# Patient Record
Sex: Female | Born: 1945 | Race: White | Hispanic: No | Marital: Married | Smoking: Never smoker
Health system: Southern US, Community
[De-identification: ages and names within clinical notes are randomized; demographics above are authoritative.]

## PROBLEM LIST (undated history)

## (undated) DIAGNOSIS — C801 Malignant (primary) neoplasm, unspecified: Secondary | ICD-10-CM

## (undated) DIAGNOSIS — I4891 Unspecified atrial fibrillation: Secondary | ICD-10-CM

## (undated) HISTORY — DX: Unspecified atrial fibrillation: I48.91

## (undated) HISTORY — DX: Malignant (primary) neoplasm, unspecified: C80.1

## (undated) HISTORY — PX: BREAST EXCISIONAL BIOPSY: SUR124

## (undated) HISTORY — PX: OTHER SURGICAL HISTORY: SHX169

## (undated) HISTORY — PX: ABDOMINAL HYSTERECTOMY: SHX81

---

## 1998-04-16 ENCOUNTER — Other Ambulatory Visit: Admission: RE | Admit: 1998-04-16 | Discharge: 1998-04-16 | Payer: Self-pay

## 1999-04-15 ENCOUNTER — Other Ambulatory Visit: Admission: RE | Admit: 1999-04-15 | Discharge: 1999-04-15 | Payer: Self-pay | Admitting: Family Medicine

## 2004-05-08 ENCOUNTER — Other Ambulatory Visit: Admission: RE | Admit: 2004-05-08 | Discharge: 2004-05-08 | Payer: Self-pay | Admitting: *Deleted

## 2005-05-11 ENCOUNTER — Other Ambulatory Visit: Admission: RE | Admit: 2005-05-11 | Discharge: 2005-05-11 | Payer: Self-pay | Admitting: *Deleted

## 2005-07-06 ENCOUNTER — Encounter: Admission: RE | Admit: 2005-07-06 | Discharge: 2005-07-06 | Payer: Self-pay | Admitting: Family Medicine

## 2006-05-20 ENCOUNTER — Other Ambulatory Visit: Admission: RE | Admit: 2006-05-20 | Discharge: 2006-05-20 | Payer: Self-pay | Admitting: *Deleted

## 2006-07-07 ENCOUNTER — Encounter: Admission: RE | Admit: 2006-07-07 | Discharge: 2006-07-07 | Payer: Self-pay | Admitting: Family Medicine

## 2007-07-11 ENCOUNTER — Encounter: Admission: RE | Admit: 2007-07-11 | Discharge: 2007-07-11 | Payer: Self-pay | Admitting: *Deleted

## 2008-07-11 ENCOUNTER — Encounter: Admission: RE | Admit: 2008-07-11 | Discharge: 2008-07-11 | Payer: Self-pay | Admitting: Family Medicine

## 2009-05-29 ENCOUNTER — Other Ambulatory Visit: Admission: RE | Admit: 2009-05-29 | Discharge: 2009-05-29 | Payer: Self-pay | Admitting: Family Medicine

## 2009-07-16 ENCOUNTER — Encounter: Admission: RE | Admit: 2009-07-16 | Discharge: 2009-07-16 | Payer: Self-pay | Admitting: Family Medicine

## 2010-07-16 ENCOUNTER — Encounter: Admission: RE | Admit: 2010-07-16 | Discharge: 2010-07-16 | Payer: Self-pay | Admitting: Internal Medicine

## 2010-07-17 ENCOUNTER — Encounter: Admission: RE | Admit: 2010-07-17 | Discharge: 2010-07-17 | Payer: Self-pay | Admitting: Internal Medicine

## 2011-06-09 ENCOUNTER — Other Ambulatory Visit: Payer: Self-pay | Admitting: Internal Medicine

## 2011-06-09 DIAGNOSIS — Z1231 Encounter for screening mammogram for malignant neoplasm of breast: Secondary | ICD-10-CM

## 2011-07-23 ENCOUNTER — Ambulatory Visit: Payer: Self-pay

## 2012-04-14 ENCOUNTER — Ambulatory Visit
Admission: RE | Admit: 2012-04-14 | Discharge: 2012-04-14 | Disposition: A | Discharge status: Home / Self Care | Payer: Medicare Other | Source: Ambulatory Visit | Attending: Internal Medicine | Admitting: Internal Medicine

## 2012-04-14 DIAGNOSIS — Z1231 Encounter for screening mammogram for malignant neoplasm of breast: Secondary | ICD-10-CM

## 2012-06-10 ENCOUNTER — Other Ambulatory Visit: Payer: Self-pay | Admitting: Internal Medicine

## 2012-06-10 DIAGNOSIS — Z780 Asymptomatic menopausal state: Secondary | ICD-10-CM

## 2012-06-10 DIAGNOSIS — M949 Disorder of cartilage, unspecified: Secondary | ICD-10-CM

## 2012-07-20 ENCOUNTER — Ambulatory Visit
Admission: RE | Admit: 2012-07-20 | Discharge: 2012-07-20 | Disposition: A | Discharge status: Home / Self Care | Payer: BC Managed Care – PPO | Source: Ambulatory Visit | Attending: Internal Medicine | Admitting: Internal Medicine

## 2012-07-20 DIAGNOSIS — M899 Disorder of bone, unspecified: Secondary | ICD-10-CM

## 2012-07-20 DIAGNOSIS — Z780 Asymptomatic menopausal state: Secondary | ICD-10-CM

## 2013-03-06 ENCOUNTER — Other Ambulatory Visit: Payer: Self-pay

## 2013-03-06 DIAGNOSIS — Z1231 Encounter for screening mammogram for malignant neoplasm of breast: Secondary | ICD-10-CM

## 2013-04-19 ENCOUNTER — Ambulatory Visit
Admission: RE | Admit: 2013-04-19 | Discharge: 2013-04-19 | Disposition: A | Discharge status: Home / Self Care | Payer: Medicare Other | Source: Ambulatory Visit

## 2013-04-19 DIAGNOSIS — Z1231 Encounter for screening mammogram for malignant neoplasm of breast: Secondary | ICD-10-CM

## 2014-03-14 ENCOUNTER — Other Ambulatory Visit: Payer: Self-pay

## 2014-03-14 DIAGNOSIS — Z1231 Encounter for screening mammogram for malignant neoplasm of breast: Secondary | ICD-10-CM

## 2014-04-24 ENCOUNTER — Encounter (INDEPENDENT_AMBULATORY_CARE_PROVIDER_SITE_OTHER): Payer: Self-pay

## 2014-04-24 ENCOUNTER — Ambulatory Visit
Admission: RE | Admit: 2014-04-24 | Discharge: 2014-04-24 | Disposition: A | Discharge status: Home / Self Care | Payer: 59 | Source: Ambulatory Visit

## 2014-04-24 DIAGNOSIS — Z1231 Encounter for screening mammogram for malignant neoplasm of breast: Secondary | ICD-10-CM

## 2014-06-27 ENCOUNTER — Ambulatory Visit (INDEPENDENT_AMBULATORY_CARE_PROVIDER_SITE_OTHER): Payer: 59

## 2014-06-27 VITALS — BP 138/88 | HR 69 | Resp 18

## 2014-06-27 DIAGNOSIS — M7750 Other enthesopathy of unspecified foot: Secondary | ICD-10-CM

## 2014-06-27 DIAGNOSIS — M779 Enthesopathy, unspecified: Principal | ICD-10-CM

## 2014-06-27 DIAGNOSIS — M19079 Primary osteoarthritis, unspecified ankle and foot: Secondary | ICD-10-CM

## 2014-06-27 DIAGNOSIS — M778 Other enthesopathies, not elsewhere classified: Secondary | ICD-10-CM

## 2014-06-27 DIAGNOSIS — M722 Plantar fascial fibromatosis: Secondary | ICD-10-CM

## 2014-06-27 DIAGNOSIS — I872 Venous insufficiency (chronic) (peripheral): Secondary | ICD-10-CM

## 2014-06-27 NOTE — Progress Notes (Signed)
   Subjective:    Patient ID: Carla Ross, female    DOB: 1946/06/26, 68 y.o.   MRN: [id number]  HPI I AM IN NEED OF SOME INSERTS AND I DO WALK 2 MILES AND I ALSO HAVE AN INGROWN TOENAIL ON THE LEFT AND I WENT TO SEE DR JEFFRIES BECAUSE I THOUGHT I HAD A STRESS FRACTURE BUT HE STATED THAT I DID NOT AND I HAVE THESE PLACES ON THE TOP OF BOTH FEET    Review of Systems  Musculoskeletal:       JOINT PAIN  Hematological: Bruises/bleeds easily.  All other systems reviewed and are negative.      Objective:   Physical Exam Is a 68 year old white female well-developed well-nourished oriented x3 presents this time with vital signs stable patient is requesting new orthotics is relatively high arch her cavus-type foot has been wearing orthotics for plantar fascial symptomology and capsulitis the midfoot bilateral for years orthotics are currently worn out in the replacing shoes that she lost her orthotic and lower they're worn out in patient is requesting the orthoses at this time.  Lower extremity objective findings reveal neurovascular status is intact pedal pulses are palpable DP and PT +2/4 bilateral capillary refill time 3 seconds all digits mild varicosities noted patient candidate for compression stockings at elastic therapy a coupon is given. Lower extremity neurologically epicritic and proprioceptive sensations intact and symmetric bilateral normal plantar response and DTRs noted neurologically skin color pigment normal hair growth absent nails somewhat criptotic otherwise unremarkable no signs of infection orthopedic exam rectus foot type cavus-type arch there had been a history of plantar fascial symptomology clinically there is some spurring of the Lisfranc's first and second MTP her first and second met cuneiform articulations there is some arthrosis Lisfranc joint bilateral capsulitis as well as history plantar fasciitis symptoms was managed with orthoses. Patient wears sandals that have  support such as bionic or [employer]. 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 Make arrangements for orthotic casting at her convenience a semiflexed orthotic cork and leather type design would be beneficial patient will be reappointed her convenience for followup  Harriet Masson DPM

## 2014-06-27 NOTE — Patient Instructions (Signed)

## 2014-07-04 ENCOUNTER — Other Ambulatory Visit: Payer: 59

## 2014-07-04 DIAGNOSIS — M722 Plantar fascial fibromatosis: Secondary | ICD-10-CM

## 2014-07-12 ENCOUNTER — Other Ambulatory Visit: Payer: Self-pay | Admitting: Internal Medicine

## 2014-07-12 DIAGNOSIS — M8580 Other specified disorders of bone density and structure, unspecified site: Secondary | ICD-10-CM

## 2014-07-26 ENCOUNTER — Ambulatory Visit
Admission: RE | Admit: 2014-07-26 | Discharge: 2014-07-26 | Disposition: A | Discharge status: Home / Self Care | Payer: 59 | Source: Ambulatory Visit | Attending: Internal Medicine | Admitting: Internal Medicine

## 2014-07-26 DIAGNOSIS — M8580 Other specified disorders of bone density and structure, unspecified site: Secondary | ICD-10-CM

## 2014-08-02 ENCOUNTER — Ambulatory Visit (INDEPENDENT_AMBULATORY_CARE_PROVIDER_SITE_OTHER): Payer: 59

## 2014-08-02 VITALS — BP 109/70 | HR 64 | Resp 12

## 2014-08-02 DIAGNOSIS — M19079 Primary osteoarthritis, unspecified ankle and foot: Secondary | ICD-10-CM

## 2014-08-02 DIAGNOSIS — M779 Enthesopathy, unspecified: Principal | ICD-10-CM

## 2014-08-02 DIAGNOSIS — M722 Plantar fascial fibromatosis: Secondary | ICD-10-CM

## 2014-08-02 DIAGNOSIS — M7750 Other enthesopathy of unspecified foot: Secondary | ICD-10-CM

## 2014-08-02 DIAGNOSIS — M778 Other enthesopathies, not elsewhere classified: Secondary | ICD-10-CM

## 2014-08-02 NOTE — Patient Instructions (Signed)

## 2014-08-02 NOTE — Progress Notes (Signed)
   Subjective:    Patient ID: Carla Ross, female    DOB: 01-Jan-1946, 68 y.o.   MRN: [id number]  HPI  DISPENSED ORTHOTICS AND GIVEN INSTRUCTION.  Review of Systems no new findings or systemic changes noted     Objective:   Physical Exam Neurovascular status is intact pedal pulses epicritic sensations intact and symmetric there is normal plantar response patient does have capsulitis Lisfranc's as was plantar fascial symptomology which responded to taping at this time new functional orthoses with appropriate [employer] are dispensed orthotics fit and contour well to the patient's foot oral and written instructions for use of the orthotic and recheck in recheck in one to 2 months for adjustments if needed       Assessment & Plan:  Assessment this time capsulitis Lisfranc's as well as plantar fasciitis symptomology responded to biomechanical support should respond to functional orthotics which are dispensed at this time with instructions recheck in one to 2 months for orthotic adjustments if needed orthotics are fitted capacious casual shoes at this time should work in her casual as well as or athletic shoes.  Harriet Masson DPM

## 2014-08-03 ENCOUNTER — Ambulatory Visit: Payer: 59

## 2015-03-19 ENCOUNTER — Other Ambulatory Visit: Payer: Self-pay

## 2015-03-19 DIAGNOSIS — Z1231 Encounter for screening mammogram for malignant neoplasm of breast: Secondary | ICD-10-CM

## 2015-04-10 DIAGNOSIS — E7800 Pure hypercholesterolemia, unspecified: Secondary | ICD-10-CM

## 2015-04-10 DIAGNOSIS — I480 Paroxysmal atrial fibrillation: Secondary | ICD-10-CM

## 2015-04-10 HISTORY — DX: Pure hypercholesterolemia, unspecified: E78.00

## 2015-04-10 HISTORY — DX: Paroxysmal atrial fibrillation: I48.0

## 2015-04-30 ENCOUNTER — Ambulatory Visit
Admission: RE | Admit: 2015-04-30 | Discharge: 2015-04-30 | Disposition: A | Discharge status: Home / Self Care | Payer: Medicare Other | Source: Ambulatory Visit

## 2015-04-30 DIAGNOSIS — Z1231 Encounter for screening mammogram for malignant neoplasm of breast: Secondary | ICD-10-CM

## 2016-04-23 ENCOUNTER — Other Ambulatory Visit: Payer: Self-pay | Admitting: Internal Medicine

## 2016-04-23 DIAGNOSIS — Z1231 Encounter for screening mammogram for malignant neoplasm of breast: Secondary | ICD-10-CM

## 2016-05-07 ENCOUNTER — Ambulatory Visit
Admission: RE | Admit: 2016-05-07 | Discharge: 2016-05-07 | Disposition: A | Discharge status: Home / Self Care | Payer: Medicare Other | Source: Ambulatory Visit | Attending: Internal Medicine | Admitting: Internal Medicine

## 2016-05-07 DIAGNOSIS — Z1231 Encounter for screening mammogram for malignant neoplasm of breast: Secondary | ICD-10-CM

## 2017-05-07 ENCOUNTER — Other Ambulatory Visit: Payer: Self-pay | Admitting: Internal Medicine

## 2017-05-07 DIAGNOSIS — Z1231 Encounter for screening mammogram for malignant neoplasm of breast: Secondary | ICD-10-CM

## 2017-05-11 ENCOUNTER — Ambulatory Visit
Admission: RE | Admit: 2017-05-11 | Discharge: 2017-05-11 | Disposition: A | Discharge status: Home / Self Care | Payer: Medicare Other | Source: Ambulatory Visit | Attending: Internal Medicine | Admitting: Internal Medicine

## 2017-05-11 DIAGNOSIS — Z1231 Encounter for screening mammogram for malignant neoplasm of breast: Secondary | ICD-10-CM

## 2017-05-25 ENCOUNTER — Ambulatory Visit (INDEPENDENT_AMBULATORY_CARE_PROVIDER_SITE_OTHER): Payer: Medicare Other | Admitting: Cardiology

## 2017-05-25 ENCOUNTER — Encounter: Payer: Self-pay | Admitting: Cardiology

## 2017-05-25 VITALS — BP 112/68 | HR 60 | Resp 10 | Ht 67.0 in | Wt 139.0 lb

## 2017-05-25 DIAGNOSIS — I480 Paroxysmal atrial fibrillation: Secondary | ICD-10-CM

## 2017-05-25 DIAGNOSIS — E7800 Pure hypercholesterolemia, unspecified: Secondary | ICD-10-CM | POA: Diagnosis not present

## 2017-05-25 NOTE — Progress Notes (Signed)
Cardiology Office Note:    Date:  05/25/2017   ID:  Carla Ross, DOB Jun 27, 1946, MRN [id number]  PCP:  Ernestene Kiel, MD  Cardiologist:  Jenne Campus, MD    Referring MD: Ernestene Kiel, MD   Chief Complaint  Patient presents with  . Follow-up  I'm doing fine  History of Present Illness:    Carla Ross is a 71 y.o. female  she is doing well. She had cold symptoms few weeks ago and had some palpitations but nonsustained at the time since that time she's feeling well. No chest pain tightness squeezing pressure burning chest.  Past Medical History:  Diagnosis Date  . A-fib ([place])   . Cancer [facility])     Past Surgical History:  Procedure Laterality Date  . ABDOMINAL HYSTERECTOMY    . BREAST EXCISIONAL BIOPSY Right    benign  . PILENDAL CYST SURGERY      Current Medications: Current Meds  Medication Sig  . aspirin EC 325 MG tablet Take 325 mg by mouth daily.  . Calcium Carbonate (CALTRATE 600 PO) Take by mouth.  . Multiple Vitamin (MULTIVITAMIN) capsule Take 1 capsule by mouth daily.  . Omega-3 Fatty Acids (FISH OIL) 500 MG CAPS Take 520 mg by mouth 2 (two) times daily.      Allergies:   Statins   Social History   Social History  . Marital status: Married    Spouse name: N/A  . Number of children: N/A  . Years of education: N/A   Social History Main Topics  . Smoking status: Never Smoker  . Smokeless tobacco: Never Used  . Alcohol use Yes  . Drug use: No  . Sexual activity: Not Asked   Other Topics Concern  . None   Social History Narrative  . None     Family History: The patient's family history includes Liver cancer in her father; Ovarian cancer in her mother. ROS:   Please see the history of present illness.    All 14 point review of systems negative except as described per history of present illness  EKGs/Labs/Other Studies Reviewed:      Recent Labs: No results found for requested labs within last 8760 hours.  Recent  Lipid Panel No results found for: CHOL, TRIG, HDL, CHOLHDL, VLDL, LDLCALC, LDLDIRECT  Physical Exam:    VS:  BP 112/68   Pulse 60   Resp 10   Ht 5\' 7"  (1.702 m)   Wt 139 lb (63 kg)   BMI 21.77 kg/m     Wt Readings from Last 3 Encounters:  05/25/17 139 lb (63 kg)     GEN:  Well nourished, well developed in no acute distress HEENT: Normal NECK: No JVD; No carotid bruits LYMPHATICS: No lymphadenopathy CARDIAC: RRR, no murmurs, no rubs, no gallops RESPIRATORY:  Clear to auscultation without rales, wheezing or rhonchi  ABDOMEN: Soft, non-tender, non-distended MUSCULOSKELETAL:  No edema; No deformity  SKIN: Warm and dry LOWER EXTREMITIES: no swelling NEUROLOGIC:  Alert and oriented x 3 PSYCHIATRIC:  Normal affect   ASSESSMENT:    1. Paroxysmal atrial fibrillation (HCC)   2. Pure hypercholesterolemia    PLAN:    In order of problems listed above:  1. Paroxysmal atrial fibrillation: Denies having any palpitations. Overall seems to be doing well. I will continue present management. She is only on aspirin. She prefers to be that way. 2. Dyslipidemia: We'll have a long discussion about that. Her HDL is high her LDL is 109.  She does not want to take statin. We had discussion regarding fish oil and that recent studies showing [place] doubtful benefits. She wants to continue which is fine with me.   Medication Adjustments/Labs and Tests Ordered: Current medicines are reviewed at length with the patient today.  Concerns regarding medicines are outlined above.  No orders of the defined types were placed in this encounter.  Medication changes: No orders of the defined types were placed in this encounter.   Signed, Park Liter, MD, [facility] 05/25/2017 8:49 AM    [place]

## 2017-05-25 NOTE — Patient Instructions (Signed)
Medication Instructions:  Your physician recommends that you continue on your current medications as directed. Please refer to the Current Medication list given to you today.  Labwork: None   Testing/Procedures: None   Follow-Up: Your physician wants you to follow-up in: 1 year. You will receive a reminder letter in the mail two months in advance. If you don't receive a letter, please call our office to schedule the follow-up appointment.  Any Other Special Instructions Will Be Listed Below (If Applicable).  Please note that any paperwork needing to be filled out by the provider will need to be addressed at the front desk prior to seeing the provider. Please note that any paperwork FMLA, Disability or other documents regarding health condition is subject to a $25.00 charge that must be received prior to completion of paperwork.    If you need a refill on your cardiac medications before your next appointment, please call your pharmacy.

## 2018-03-31 ENCOUNTER — Other Ambulatory Visit: Payer: Self-pay | Admitting: Internal Medicine

## 2018-03-31 DIAGNOSIS — Z1231 Encounter for screening mammogram for malignant neoplasm of breast: Secondary | ICD-10-CM

## 2018-05-16 ENCOUNTER — Ambulatory Visit
Admission: RE | Admit: 2018-05-16 | Discharge: 2018-05-16 | Disposition: A | Discharge status: Home / Self Care | Payer: Medicare Other | Source: Ambulatory Visit | Attending: Internal Medicine | Admitting: Internal Medicine

## 2018-05-16 DIAGNOSIS — Z1231 Encounter for screening mammogram for malignant neoplasm of breast: Secondary | ICD-10-CM

## 2018-06-13 ENCOUNTER — Encounter: Payer: Self-pay | Admitting: Cardiology

## 2018-06-13 ENCOUNTER — Ambulatory Visit: Payer: Medicare Other | Admitting: Cardiology

## 2018-06-13 VITALS — BP 122/74 | HR 66 | Ht 67.0 in | Wt 140.8 lb

## 2018-06-13 DIAGNOSIS — I480 Paroxysmal atrial fibrillation: Secondary | ICD-10-CM | POA: Diagnosis not present

## 2018-06-13 DIAGNOSIS — E7800 Pure hypercholesterolemia, unspecified: Secondary | ICD-10-CM

## 2018-06-13 NOTE — Patient Instructions (Signed)
Medication Instructions:  Your physician recommends that you continue on your current medications as directed. Please refer to the Current Medication list given to you today.   Labwork: None  Testing/Procedures: You had an EKG today.   Your physician has requested that you have an echocardiogram. Echocardiography is a painless test that uses sound waves to create images of your heart. It provides your doctor with information about the size and shape of your heart and how well your heart's chambers and valves are working. This procedure takes approximately one hour. There are no restrictions for this procedure.   Follow-Up: Your physician wants you to follow-up in: 3 months. You will receive a reminder letter in the mail two months in advance. If you don't receive a letter, please call our office to schedule the follow-up appointment.   If you need a refill on your cardiac medications before your next appointment, please call your pharmacy.   Thank you for choosing [facility]! Chardonnay Holzmann, RN [phone]   Echocardiogram An echocardiogram, or echocardiography, uses sound waves (ultrasound) to produce an image of your heart. The echocardiogram is simple, painless, obtained within a short period of time, and offers valuable information to your health care provider. The images from an echocardiogram can provide information such as:  Evidence of coronary artery disease (CAD).  Heart size.  Heart muscle function.  Heart valve function.  Aneurysm detection.  Evidence of a past heart attack.  Fluid buildup around the heart.  Heart muscle thickening.  Assess heart valve function.  Tell a health care provider about:  Any allergies you have.  All medicines you are taking, including vitamins, herbs, eye drops, creams, and over-the-counter medicines.  Any problems you or family members have had with anesthetic medicines.  Any blood disorders you have.  Any  surgeries you have had.  Any medical conditions you have.  Whether you are pregnant or may be pregnant. What happens before the procedure? No special preparation is needed. Eat and drink normally. What happens during the procedure?  In order to produce an image of your heart, gel will be applied to your chest and a wand-like tool (transducer) will be moved over your chest. The gel will help transmit the sound waves from the transducer. The sound waves will harmlessly bounce off your heart to allow the heart images to be captured in real-time motion. These images will then be recorded.  You may need an IV to receive a medicine that improves the quality of the pictures. What happens after the procedure? You may return to your normal schedule including diet, activities, and medicines, unless your health care provider tells you otherwise. This information is not intended to replace advice given to you by your health care provider. Make sure you discuss any questions you have with your health care provider. Document Released: 10/02/2000 Document Revised: 05/23/2016 Document Reviewed: 06/12/2013 Elsevier Interactive Patient Education  2017 Elsevier Inc.  

## 2018-06-13 NOTE — Progress Notes (Signed)
Cardiology Office Note:    Date:  06/13/2018   ID:  Carla Ross, DOB [birth year], MRN [id number]  PCP:  Ernestene Kiel, MD  Cardiologist:  Jenne Campus, MD    Referring MD: Ernestene Kiel, MD   Chief Complaint  Patient presents with  . Follow-up  Doing fair  History of Present Illness:    Carla Ross is a 72 y.o. female with history of paroxysmal atrial fibrillation.  Also dyslipidemia comes today to office for follow-up for last few weeks she had some palpitations.  She attributes this to the fact that she got very stressful situation with her husband her husband has depression and he is actually being in psychiatry hospital since last Wednesday.  There are plans to discharge home tomorrow no chest pain tightness squeezing pressure burning chest.  Described to have palpitations before but now since her husband is in the hospital situation stabilized she is doing well  Past Medical History:  Diagnosis Date  . A-fib ([place])   . Cancer [facility])     Past Surgical History:  Procedure Laterality Date  . ABDOMINAL HYSTERECTOMY    . BREAST EXCISIONAL BIOPSY Right    benign  . PILENDAL CYST SURGERY      Current Medications: Current Meds  Medication Sig  . aspirin EC 325 MG tablet Take 325 mg by mouth daily.  . Calcium Carbonate (CALTRATE 600 PO) Take by mouth.  . Multiple Vitamin (MULTIVITAMIN) capsule Take 1 capsule by mouth daily.  . Omega-3 Fatty Acids (FISH OIL) 1200 MG CAPS Take 1,200 mg by mouth 2 (two) times daily.      Allergies:   Statins   Social History   Socioeconomic History  . Marital status: Married    Spouse name: Not on file  . Number of children: Not on file  . Years of education: Not on file  . Highest education level: Not on file  Occupational History  . Not on file  Social Needs  . Financial resource strain: Not on file  . Food insecurity:    Worry: Not on file    Inability: Not on file  . Transportation needs:    Medical:  Not on file    Non-medical: Not on file  Tobacco Use  . Smoking status: Never Smoker  . Smokeless tobacco: Never Used  Substance and Sexual Activity  . Alcohol use: Yes  . Drug use: No  . Sexual activity: Not on file  Lifestyle  . Physical activity:    Days per week: Not on file    Minutes per session: Not on file  . Stress: Not on file  Relationships  . Social connections:    Talks on phone: Not on file    Gets together: Not on file    Attends religious service: Not on file    Active member of club or organization: Not on file    Attends meetings of clubs or organizations: Not on file    Relationship status: Not on file  Other Topics Concern  . Not on file  Social History Narrative  . Not on file     Family History: The patient's family history includes Liver cancer in her father; Ovarian cancer in her mother. ROS:   Please see the history of present illness.    All 14 point review of systems negative except as described per history of present illness  EKGs/Labs/Other Studies Reviewed:    EKG today showed normal sinus rhythm right atrial enlargement normal  ST segment changes.  Recent Labs: No results found for requested labs within last 8760 hours.  Recent Lipid Panel No results found for: CHOL, TRIG, HDL, CHOLHDL, VLDL, LDLCALC, LDLDIRECT  Physical Exam:    VS:  BP 122/74   Pulse 66   Ht 5\' 7"  (1.702 m)   Wt 140 lb 12.8 oz (63.9 kg)   SpO2 98%   BMI 22.05 kg/m     Wt Readings from Last 3 Encounters:  06/13/18 140 lb 12.8 oz (63.9 kg)  05/25/17 139 lb (63 kg)     GEN:  Well nourished, well developed in no acute distress HEENT: Normal NECK: No JVD; No carotid bruits LYMPHATICS: No lymphadenopathy CARDIAC: RRR, no murmurs, no rubs, no gallops RESPIRATORY:  Clear to auscultation without rales, wheezing or rhonchi  ABDOMEN: Soft, non-tender, non-distended MUSCULOSKELETAL:  No edema; No deformity  SKIN: Warm and dry LOWER EXTREMITIES: no  swelling NEUROLOGIC:  Alert and oriented x 3 PSYCHIATRIC:  Normal affect   ASSESSMENT:    1. Paroxysmal atrial fibrillation (HCC)   2. Pure hypercholesterolemia    PLAN:    In order of problems listed above:  1. Paroxysmal atrial fibrillation.  I will ask her to have echocardiogram the purpose of echocardiogram is to check the left atrial size.  I will see her back in about 3 months to see how she does if she still have recurrences of arrhythmia then we will be forced to put monitor on her.  Her chads 2 Vascor equals 2 she does not want to take anticoagulation she wants to stay on aspirin.  In the future however if we discover that she does still significant frequent episode of atrial fibrillation then we will just has this issue again 2. Dyslipidemia her primary care physician check a fasting lipid profile lately and apparently is very good we will try to get a copy of it.  See her back in my office 3 months sooner if she get a problem   Medication Adjustments/Labs and Tests Ordered: Current medicines are reviewed at length with the patient today.  Concerns regarding medicines are outlined above.  No orders of the defined types were placed in this encounter.  Medication changes: No orders of the defined types were placed in this encounter.   Signed, Park Liter, MD, [facility] 06/13/2018 1:55 PM    [place]

## 2018-06-14 DIAGNOSIS — M7501 Adhesive capsulitis of right shoulder: Secondary | ICD-10-CM

## 2018-06-14 HISTORY — DX: Adhesive capsulitis of right shoulder: M75.01

## 2018-07-13 ENCOUNTER — Ambulatory Visit: Payer: Medicare Other | Admitting: Cardiology

## 2018-07-29 ENCOUNTER — Ambulatory Visit (INDEPENDENT_AMBULATORY_CARE_PROVIDER_SITE_OTHER): Payer: Medicare Other

## 2018-07-29 DIAGNOSIS — I480 Paroxysmal atrial fibrillation: Secondary | ICD-10-CM

## 2018-07-29 NOTE — Progress Notes (Signed)
Complete echocardiogram has been performed.  Jimmy Javian Nudd RDCS, RVT 

## 2019-03-09 DIAGNOSIS — M25562 Pain in left knee: Secondary | ICD-10-CM | POA: Insufficient documentation

## 2019-04-06 ENCOUNTER — Other Ambulatory Visit: Payer: Self-pay | Admitting: Internal Medicine

## 2019-04-06 DIAGNOSIS — Z1231 Encounter for screening mammogram for malignant neoplasm of breast: Secondary | ICD-10-CM

## 2019-05-23 ENCOUNTER — Ambulatory Visit
Admission: RE | Admit: 2019-05-23 | Discharge: 2019-05-23 | Disposition: A | Discharge status: Home / Self Care | Payer: Medicare Other | Source: Ambulatory Visit | Attending: Internal Medicine | Admitting: Internal Medicine

## 2019-05-23 ENCOUNTER — Other Ambulatory Visit: Payer: Self-pay

## 2019-05-23 DIAGNOSIS — Z1231 Encounter for screening mammogram for malignant neoplasm of breast: Secondary | ICD-10-CM

## 2019-05-24 ENCOUNTER — Other Ambulatory Visit: Payer: Self-pay | Admitting: Internal Medicine

## 2019-05-24 DIAGNOSIS — E2839 Other primary ovarian failure: Secondary | ICD-10-CM

## 2019-06-09 ENCOUNTER — Ambulatory Visit: Payer: Medicare Other | Admitting: Cardiology

## 2019-06-12 ENCOUNTER — Encounter: Payer: Self-pay | Admitting: Cardiology

## 2019-06-12 ENCOUNTER — Ambulatory Visit (INDEPENDENT_AMBULATORY_CARE_PROVIDER_SITE_OTHER): Payer: Medicare Other | Admitting: Cardiology

## 2019-06-12 ENCOUNTER — Other Ambulatory Visit: Payer: Self-pay

## 2019-06-12 VITALS — BP 116/80 | HR 68 | Ht 67.0 in | Wt 142.8 lb

## 2019-06-12 DIAGNOSIS — E7800 Pure hypercholesterolemia, unspecified: Secondary | ICD-10-CM | POA: Diagnosis not present

## 2019-06-12 DIAGNOSIS — R002 Palpitations: Secondary | ICD-10-CM

## 2019-06-12 DIAGNOSIS — I480 Paroxysmal atrial fibrillation: Secondary | ICD-10-CM | POA: Diagnosis not present

## 2019-06-12 HISTORY — DX: Palpitations: R00.2

## 2019-06-12 NOTE — Patient Instructions (Signed)

## 2019-06-12 NOTE — Progress Notes (Signed)
Cardiology Office Note:    Date:  06/12/2019   ID:  Carla Ross, DOB March 27, 1946, MRN [id number]  PCP:  Ernestene Kiel, MD  Cardiologist:  Jenne Campus, MD    Referring MD: Ernestene Kiel, MD   Chief Complaint  Patient presents with  . Follow-up  Doing well  History of Present Illness:    Carla Ross is a 73 y.o. female with paroxysmal atrial fibrillation but there were episodes.  She described to have some palpitations only when she get upset or when she is dehydrated.  Does happen very rarely we talked about how we can document this and I offered her cardia so she can purchase this device and record her palpitation anytime she has some.  Otherwise doing well.  No chest pain tightness squeezing pressure pain chest.  Recently had cholesterol checked which was good.  We will try to get a copy of it.  Past Medical History:  Diagnosis Date  . A-fib ([place])   . Cancer [facility])     Past Surgical History:  Procedure Laterality Date  . ABDOMINAL HYSTERECTOMY    . BREAST EXCISIONAL BIOPSY Right    benign  . PILENDAL CYST SURGERY      Current Medications: Current Meds  Medication Sig  . aspirin EC 325 MG tablet Take 325 mg by mouth daily.  . Calcium Carbonate (CALTRATE 600 PO) Take by mouth.  . Multiple Vitamin (MULTIVITAMIN) capsule Take 1 capsule by mouth daily.  . Omega-3 Fatty Acids (FISH OIL) 1200 MG CAPS Take 1,200 mg by mouth 2 (two) times daily.      Allergies:   Statins   Social History   Socioeconomic History  . Marital status: Married    Spouse name: Not on file  . Number of children: Not on file  . Years of education: Not on file  . Highest education level: Not on file  Occupational History  . Not on file  Social Needs  . Financial resource strain: Not on file  . Food insecurity    Worry: Not on file    Inability: Not on file  . Transportation needs    Medical: Not on file    Non-medical: Not on file  Tobacco Use  . Smoking status:  Never Smoker  . Smokeless tobacco: Never Used  Substance and Sexual Activity  . Alcohol use: Yes  . Drug use: No  . Sexual activity: Not on file  Lifestyle  . Physical activity    Days per week: Not on file    Minutes per session: Not on file  . Stress: Not on file  Relationships  . Social Herbalist on phone: Not on file    Gets together: Not on file    Attends religious service: Not on file    Active member of club or organization: Not on file    Attends meetings of clubs or organizations: Not on file    Relationship status: Not on file  Other Topics Concern  . Not on file  Social History Narrative  . Not on file     Family History: The patient's family history includes Liver cancer in her father; Ovarian cancer in her mother. ROS:   Please see the history of present illness.    All 14 point review of systems negative except as described per history of present illness  EKGs/Labs/Other Studies Reviewed:      Recent Labs: No results found for requested labs within last 8760  hours.  Recent Lipid Panel No results found for: CHOL, TRIG, HDL, CHOLHDL, VLDL, LDLCALC, LDLDIRECT  Physical Exam:    VS:  BP 116/80   Pulse 68   Ht 5\' 7"  (1.702 m)   Wt 142 lb 12.8 oz (64.8 kg)   SpO2 98%   BMI 22.37 kg/m     Wt Readings from Last 3 Encounters:  06/12/19 142 lb 12.8 oz (64.8 kg)  06/13/18 140 lb 12.8 oz (63.9 kg)  05/25/17 139 lb (63 kg)     GEN:  Well nourished, well developed in no acute distress HEENT: Normal NECK: No JVD; No carotid bruits LYMPHATICS: No lymphadenopathy CARDIAC: RRR, no murmurs, no rubs, no gallops RESPIRATORY:  Clear to auscultation without rales, wheezing or rhonchi  ABDOMEN: Soft, non-tender, non-distended MUSCULOSKELETAL:  No edema; No deformity  SKIN: Warm and dry LOWER EXTREMITIES: no swelling NEUROLOGIC:  Alert and oriented x 3 PSYCHIATRIC:  Normal affect   ASSESSMENT:    1. Paroxysmal atrial fibrillation (HCC)   2.  Pure hypercholesterolemia   3. Palpitations    PLAN:    In order of problems listed above:  1. Paroxysmal atrial fibrillation doing well from that point review rare episode of palpitation I am not sure if this is atrial fibrillation she will purchase cardia so we can see exactly what arrhythmia family with dealing with. 2. Dyslipidemia will call primary care physician to get her fasting lipid profile 3. Palpitations plan as outlined above   Medication Adjustments/Labs and Tests Ordered: Current medicines are reviewed at length with the patient today.  Concerns regarding medicines are outlined above.  No orders of the defined types were placed in this encounter.  Medication changes: No orders of the defined types were placed in this encounter.   Signed, Park Liter, MD, [facility] 06/12/2019 9:36 AM    [place]

## 2019-06-19 ENCOUNTER — Telehealth: Payer: Self-pay | Admitting: Cardiology

## 2019-06-19 NOTE — Telephone Encounter (Signed)
Called patient. She was wanting to know what email address to send KARDIA readings to. I gave her Dr. Wendy Poet email address. No further questions.

## 2019-06-19 NOTE — Telephone Encounter (Signed)
Was supposed to get some kind of monitor and let him know the results but wants to know where to send them

## 2019-07-04 ENCOUNTER — Ambulatory Visit: Payer: Medicare Other | Admitting: Cardiology

## 2019-08-09 ENCOUNTER — Ambulatory Visit
Admission: RE | Admit: 2019-08-09 | Discharge: 2019-08-09 | Disposition: A | Discharge status: Home / Self Care | Payer: Medicare Other | Source: Ambulatory Visit | Attending: Internal Medicine | Admitting: Internal Medicine

## 2019-08-09 ENCOUNTER — Other Ambulatory Visit: Payer: Self-pay

## 2019-08-09 DIAGNOSIS — E2839 Other primary ovarian failure: Secondary | ICD-10-CM

## 2019-11-06 ENCOUNTER — Ambulatory Visit (INDEPENDENT_AMBULATORY_CARE_PROVIDER_SITE_OTHER): Payer: Medicare PPO | Admitting: Cardiology

## 2019-11-06 ENCOUNTER — Other Ambulatory Visit: Payer: Self-pay

## 2019-11-06 ENCOUNTER — Ambulatory Visit (INDEPENDENT_AMBULATORY_CARE_PROVIDER_SITE_OTHER): Payer: Medicare PPO

## 2019-11-06 ENCOUNTER — Encounter: Payer: Self-pay | Admitting: Cardiology

## 2019-11-06 VITALS — BP 130/84 | HR 72 | Ht 67.0 in | Wt 145.0 lb

## 2019-11-06 DIAGNOSIS — E7800 Pure hypercholesterolemia, unspecified: Secondary | ICD-10-CM

## 2019-11-06 DIAGNOSIS — I480 Paroxysmal atrial fibrillation: Secondary | ICD-10-CM

## 2019-11-06 DIAGNOSIS — R002 Palpitations: Secondary | ICD-10-CM | POA: Diagnosis not present

## 2019-11-06 NOTE — Patient Instructions (Addendum)
Medication Instructions:  Your physician recommends that you continue on your current medications as directed. Please refer to the Current Medication list given to you today.  *If you need a refill on your cardiac medications before your next appointment, please call your pharmacy*  Lab Work: Your physician recommends that you return for lab work today: tsh, cbc   If you have labs (blood work) drawn today and your tests are completely normal, you will receive your results only by: Marland Kitchen MyChart Message (if you have MyChart) OR . A paper copy in the mail If you have any lab test that is abnormal or we need to change your treatment, we will call you to review the results.  Testing/Procedures: Your physician has recommended that you wear a holter monitor. Holter monitors are medical devices that record the heart's electrical activity. Doctors most often use these monitors to diagnose arrhythmias. Arrhythmias are problems with the speed or rhythm of the heartbeat. The monitor is a small, portable device. You can wear one while you do your normal daily activities. This is usually used to diagnose what is causing palpitations/syncope (passing out). Wear for 7 days    Follow-Up: At [facility], you and your health needs are our priority.  As part of our continuing mission to provide you with exceptional heart care, we have created designated Provider Care Teams.  These Care Teams include your primary Cardiologist (physician) and Advanced Practice Providers (APPs -  Physician Assistants and Nurse Practitioners) who all work together to provide you with the care you need, when you need it.  Your next appointment:   1 month(s)  The format for your next appointment:   In Person  Provider:   Jenne Campus, MD  Other Instructions

## 2019-11-06 NOTE — Progress Notes (Signed)
Cardiology Office Note:    Date:  11/06/2019   ID:  SAFIRE BOLASH, DOB 12/14/1945, MRN [id number]  PCP:  Ernestene Kiel, MD  Cardiologist:  Jenne Campus, MD    Referring MD: Ernestene Kiel, MD   No chief complaint on file.   History of Present Illness:    Carla Ross is a 74 y.o. female with paroxysmal atrial fibrillation very rare episodes, dyslipidemia comes today to my office for follow-up she does have much more palpitation within a week or 2 lately.  She blames a lot of stress on that.  She thinks that it is related to very stressful situation she got at home.  Her dog is dying her husband of depression.  She actually started crying in my office.  But otherwise seems to be doing well.  Denies have any chest pain tightness squeezing pressure burning chest  Past Medical History:  Diagnosis Date  . A-fib ([place])   . Cancer [place])     Past Surgical History:  Procedure Laterality Date  . ABDOMINAL HYSTERECTOMY    . BREAST EXCISIONAL BIOPSY Right    benign  . PILENDAL CYST SURGERY      Current Medications: Current Meds  Medication Sig  . aspirin EC 325 MG tablet Take 325 mg by mouth daily.  . Calcium Carbonate (CALTRATE 600 PO) Take by mouth.  . Multiple Vitamin (MULTIVITAMIN) capsule Take 1 capsule by mouth daily.  . Omega-3 Fatty Acids (FISH OIL) 1200 MG CAPS Take 1,200 mg by mouth 2 (two) times daily.      Allergies:   Statins   Social History   Socioeconomic History  . Marital status: Married    Spouse name: Not on file  . Number of children: Not on file  . Years of education: Not on file  . Highest education level: Not on file  Occupational History  . Not on file  Tobacco Use  . Smoking status: Never Smoker  . Smokeless tobacco: Never Used  Substance and Sexual Activity  . Alcohol use: Yes  . Drug use: No  . Sexual activity: Not on file  Other Topics Concern  . Not on file  Social History Narrative  . Not on file   Social  Determinants of Health   Financial Resource Strain:   . Difficulty of Paying Living Expenses: Not on file  Food Insecurity:   . Worried About Charity fundraiser in the Last Year: Not on file  . Ran Out of Food in the Last Year: Not on file  Transportation Needs:   . Lack of Transportation (Medical): Not on file  . Lack of Transportation (Non-Medical): Not on file  Physical Activity:   . Days of Exercise per Week: Not on file  . Minutes of Exercise per Session: Not on file  Stress:   . Feeling of Stress : Not on file  Social Connections:   . Frequency of Communication with Friends and Family: Not on file  . Frequency of Social Gatherings with Friends and Family: Not on file  . Attends Religious Services: Not on file  . Active Member of Clubs or Organizations: Not on file  . Attends Archivist Meetings: Not on file  . Marital Status: Not on file     Family History: The patient's family history includes Liver cancer in her father; Ovarian cancer in her mother. ROS:   Please see the history of present illness.    All 14 point review of systems  negative except as described per history of present illness  EKGs/Labs/Other Studies Reviewed:      Recent Labs: No results found for requested labs within last 8760 hours.  Recent Lipid Panel No results found for: CHOL, TRIG, HDL, CHOLHDL, VLDL, LDLCALC, LDLDIRECT  Physical Exam:    VS:  BP 130/84 (BP Location: Left Arm, Patient Position: Sitting, Cuff Size: Normal)   Pulse 72   Ht 5\' 7"  (1.702 m)   Wt 145 lb (65.8 kg)   SpO2 98%   BMI 22.71 kg/m     Wt Readings from Last 3 Encounters:  11/06/19 145 lb (65.8 kg)  06/12/19 142 lb 12.8 oz (64.8 kg)  06/13/18 140 lb 12.8 oz (63.9 kg)     GEN:  Well nourished, well developed in no acute distress HEENT: Normal NECK: No JVD; No carotid bruits LYMPHATICS: No lymphadenopathy CARDIAC: RRR, no murmurs, no rubs, no gallops RESPIRATORY:  Clear to auscultation without  rales, wheezing or rhonchi  ABDOMEN: Soft, non-tender, non-distended MUSCULOSKELETAL:  No edema; No deformity  SKIN: Warm and dry LOWER EXTREMITIES: no swelling NEUROLOGIC:  Alert and oriented x 3 PSYCHIATRIC:  Normal affect   ASSESSMENT:    1. Paroxysmal atrial fibrillation (HCC)   2. Pure hypercholesterolemia   3. Palpitations    PLAN:    In order of problems listed above:  1. Paroxysmal atrial fibrillation she does have cardia and she does have 1 documented episode of atrial fibrillation.  She described much more palpitations right now.  I will put a Zio patch on her for a week to see what frequency of arrhythmia we have.  I will also check her CBC as well as TSH today with anticipation to start anticoagulation.  She brought results to me from September her kidney function was normal.  She will most likely require echocardiogram.  Before we initiate some antiarrhythmic therapy.  First will be AV blocking agent. 2. Dyslipidemia she brought cholesterol to me.  Her HDL is very high however LDL is also elevated.  We will continue present medications for now 3. Palpitations plans and as outlined above   Medication Adjustments/Labs and Tests Ordered: Current medicines are reviewed at length with the patient today.  Concerns regarding medicines are outlined above.  No orders of the defined types were placed in this encounter.  Medication changes: No orders of the defined types were placed in this encounter.   Signed, Park Liter, MD, [facility] 11/06/2019 10:44 AM    [place]

## 2019-11-07 LAB — CBC
Hematocrit: 43.1 % (ref 34.0–46.6)
Hemoglobin: 14.5 g/dL (ref 11.1–15.9)
MCH: 32.2 pg (ref 26.6–33.0)
MCHC: 33.6 g/dL (ref 31.5–35.7)
MCV: 96 fL (ref 79–97)
Platelets: 215 10*3/uL (ref 150–450)
RBC: 4.51 x10E6/uL (ref 3.77–5.28)
RDW: 11.4 % — ABNORMAL LOW (ref 11.7–15.4)
WBC: 6.8 10*3/uL (ref 3.4–10.8)

## 2019-11-07 LAB — TSH: TSH: 1.49 u[IU]/mL (ref 0.450–4.500)

## 2019-11-13 ENCOUNTER — Ambulatory Visit: Payer: Medicare Other | Admitting: Cardiology

## 2019-11-22 DIAGNOSIS — R002 Palpitations: Secondary | ICD-10-CM | POA: Diagnosis not present

## 2019-11-29 ENCOUNTER — Other Ambulatory Visit: Payer: Self-pay | Admitting: Cardiology

## 2019-11-29 ENCOUNTER — Telehealth: Payer: Self-pay | Admitting: *Deleted

## 2019-11-29 MED ORDER — METOPROLOL TARTRATE 25 MG PO TABS: 25.0000 mg | ORAL_TABLET | Freq: Two times a day (BID) | ORAL | 1 refills | Status: DC

## 2019-11-29 NOTE — Telephone Encounter (Signed)
-----   Message from Park Liter, MD sent at 11/29/2019  8:46 AM EST ----- 1 episode of atrial fibrillation lasting 46 minutes 58 seconds.  Average rate 107.  Please start metoprolol 25 twice daily and schedule appointment to follow-up

## 2019-12-08 ENCOUNTER — Encounter: Payer: Self-pay | Admitting: Cardiology

## 2019-12-08 ENCOUNTER — Other Ambulatory Visit: Payer: Self-pay

## 2019-12-08 ENCOUNTER — Ambulatory Visit (INDEPENDENT_AMBULATORY_CARE_PROVIDER_SITE_OTHER): Payer: Medicare PPO | Admitting: Cardiology

## 2019-12-08 VITALS — BP 118/62 | HR 56 | Temp 97.8°F | Ht 67.0 in | Wt 144.6 lb

## 2019-12-08 DIAGNOSIS — R002 Palpitations: Secondary | ICD-10-CM | POA: Diagnosis not present

## 2019-12-08 DIAGNOSIS — I480 Paroxysmal atrial fibrillation: Secondary | ICD-10-CM

## 2019-12-08 DIAGNOSIS — E7800 Pure hypercholesterolemia, unspecified: Secondary | ICD-10-CM

## 2019-12-08 MED ORDER — METOPROLOL SUCCINATE ER 50 MG PO TB24: 50.0000 mg | ORAL_TABLET | Freq: Every day | ORAL | 1 refills | Status: DC

## 2019-12-08 NOTE — Patient Instructions (Signed)
Medication Instructions:  Your physician has recommended you make the following change in your medication:   STOP: Metoprolol tartrate   START: Metoprolol succinate 50 mg daily  *If you need a refill on your cardiac medications before your next appointment, please call your pharmacy*  Lab Work: None.  If you have labs (blood work) drawn today and your tests are completely normal, you will receive your results only by: Marland Kitchen MyChart Message (if you have MyChart) OR . A paper copy in the mail If you have any lab test that is abnormal or we need to change your treatment, we will call you to review the results.  Testing/Procedures: None.   Follow-Up: At [facility], you and your health needs are our priority.  As part of our continuing mission to provide you with exceptional heart care, we have created designated Provider Care Teams.  These Care Teams include your primary Cardiologist (physician) and Advanced Practice Providers (APPs -  Physician Assistants and Nurse Practitioners) who all work together to provide you with the care you need, when you need it.  Your next appointment:   3 month(s)  The format for your next appointment:   In Person  Provider:   Jenne Campus, MD  Other Instructions  Metoprolol Extended-Release Tablets What is this medicine? METOPROLOL (me TOE proe lole) is a beta blocker. It decreases the amount of work your heart has to do and helps your heart beat regularly. It treats high blood pressure and/or prevent chest pain (also called angina). It also treats heart failure. This medicine may be used for other purposes; ask your health care provider or pharmacist if you have questions. COMMON BRAND NAME(S): toprol, Toprol XL What should I tell my health care provider before I take this medicine? They need to know if you have any of these conditions:  diabetes  heart or vessel disease like slow heart rate, worsening heart failure, heart block, sick sinus  syndrome or Raynaud's disease  kidney disease  liver disease  lung or breathing disease, like asthma or emphysema  pheochromocytoma  thyroid disease  an unusual or allergic reaction to metoprolol, other beta-blockers, medicines, foods, dyes, or preservatives  pregnant or trying to get pregnant  breast-feeding How should I use this medicine? Take this drug by mouth. Take it as directed on the prescription label at the same time every day. Take it with food. You may cut the tablet in half if it is scored (has a line in the middle of it). This may help you swallow the tablet if the whole tablet is too big. Be sure to take both halves. Do not take just one-half of the tablet. Keep taking it unless your health care provider tells you to stop. Talk to your health care provider about the use of this drug in children. While it may be prescribed for children as young as 6 for selected conditions, precautions do apply. Overdosage: If you think you have taken too much of this medicine contact a poison control center or emergency room at once. NOTE: This medicine is only for you. Do not share this medicine with others. What if I miss a dose? If you miss a dose, take it as soon as you can. If it is almost time for your next dose, take only that dose. Do not take double or extra doses. What may interact with this medicine? This medicine may interact with the following medications:  certain medicines for blood pressure, heart disease, irregular heart beat  certain medicines for depression, like monoamine oxidase (MAO) inhibitors, fluoxetine, or paroxetine  clonidine  dobutamine  epinephrine  isoproterenol  reserpine This list may not describe all possible interactions. Give your health care provider a list of all the medicines, herbs, non-prescription drugs, or dietary supplements you use. Also tell them if you smoke, drink alcohol, or use illegal drugs. Some items may interact with your  medicine. What should I watch for while using this medicine? Visit your doctor or health care professional for regular check ups. Contact your doctor right away if your symptoms worsen. Check your blood pressure and pulse rate regularly. Ask your health care professional what your blood pressure and pulse rate should be, and when you should contact them. You may get drowsy or dizzy. Do not drive, use machinery, or do anything that needs mental alertness until you know how this medicine affects you. Do not sit or stand up quickly, especially if you are an older patient. This reduces the risk of dizzy or fainting spells. Contact your doctor if these symptoms continue. Alcohol may interfere with the effect of this medicine. Avoid alcoholic drinks. This medicine may increase blood sugar. Ask your healthcare provider if changes in diet or medicines are needed if you have diabetes. What side effects may I notice from receiving this medicine? Side effects that you should report to your doctor or health care professional as soon as possible:  allergic reactions like skin rash, itching or hives  cold or numb hands or feet  depression  difficulty breathing  faint  fever with sore throat  irregular heartbeat, chest pain  rapid weight gain   signs and symptoms of high blood sugar such as being more thirsty or hungry or having to urinate more than normal. You may also feel very tired or have blurry vision.  swollen legs or ankles Side effects that usually do not require medical attention (report to your doctor or health care professional if they continue or are bothersome):  anxiety or nervousness  change in sex drive or performance  dry skin  headache  nightmares or trouble sleeping  short term memory loss  stomach upset or diarrhea This list may not describe all possible side effects. Call your doctor for medical advice about side effects. You may report side effects to FDA at  1-800-FDA-1088. Where should I keep my medicine? Keep out of the reach of children and pets. Store at room temperature between 20 and 25 degrees C (68 and 77 degrees F). Throw away any unused drug after the expiration date. NOTE: This sheet is a summary. It may not cover all possible information. If you have questions about this medicine, talk to your doctor, pharmacist, or health care provider.  2020 Elsevier/Gold Standard (2019-05-18 18:23:00)

## 2019-12-08 NOTE — Progress Notes (Signed)
Cardiology Office Note:    Date:  12/08/2019   ID:  Carla Ross, DOB 04-Mar-1946, MRN [id number]  PCP:  Ernestene Kiel, MD  Cardiologist:  Jenne Campus, MD    Referring MD: Ernestene Kiel, MD   No chief complaint on file. Doing  History of Present Illness:    Carla Ross is a 74 y.o. female medical history significant for atrial fibrillation which is paroxysmal, CHA2DS2-VASc score is only 2.  What was monitor on her which showed 1 episode of atrial fibrillation lasting 45 minutes.  eRate was excessive.  Applicable beta-blocker she comes today to my office to discuss this issue.  Overall she is doing much better with Toprol 25 twice daily.  I talked in length about potentially starting anticoagulation.  09/27/2019 calculated average hospital_with prolonged term.  History desaturation we can either start anticoagulation with now or wait until she will turn 60 which will be November and then started coming this is the option that she elected to choose.  I  Past Medical History:  Diagnosis Date  . A-fib ([place])   . Cancer [facility])     Past Surgical History:  Procedure Laterality Date  . ABDOMINAL HYSTERECTOMY    . BREAST EXCISIONAL BIOPSY Right    benign  . PILENDAL CYST SURGERY      Current Medications: Current Meds  Medication Sig  . aspirin EC 325 MG tablet Take 325 mg by mouth daily.  . Calcium Carbonate (CALTRATE 600 PO) Take by mouth.  . metoprolol tartrate (LOPRESSOR) 25 MG tablet TAKE 1 TABLET(25 MG) BY MOUTH TWICE DAILY  . Multiple Vitamin (MULTIVITAMIN) capsule Take 1 capsule by mouth daily.  . Omega-3 Fatty Acids (FISH OIL) 1200 MG CAPS Take 1,200 mg by mouth 2 (two) times daily.      Allergies:   Statins   Social History   Socioeconomic History  . Marital status: Married    Spouse name: Not on file  . Number of children: Not on file  . Years of education: Not on file  . Highest education level: Not on file  Occupational History  . Not on  file  Tobacco Use  . Smoking status: Never Smoker  . Smokeless tobacco: Never Used  Substance and Sexual Activity  . Alcohol use: Yes  . Drug use: No  . Sexual activity: Not on file  Other Topics Concern  . Not on file  Social History Narrative  . Not on file   Social Determinants of Health   Financial Resource Strain:   . Difficulty of Paying Living Expenses: Not on file  Food Insecurity:   . Worried About Charity fundraiser in the Last Year: Not on file  . Ran Out of Food in the Last Year: Not on file  Transportation Needs:   . Lack of Transportation (Medical): Not on file  . Lack of Transportation (Non-Medical): Not on file  Physical Activity:   . Days of Exercise per Week: Not on file  . Minutes of Exercise per Session: Not on file  Stress:   . Feeling of Stress : Not on file  Social Connections:   . Frequency of Communication with Friends and Family: Not on file  . Frequency of Social Gatherings with Friends and Family: Not on file  . Attends Religious Services: Not on file  . Active Member of Clubs or Organizations: Not on file  . Attends Archivist Meetings: Not on file  . Marital Status: Not on file  Family History: The patient's family history includes Liver cancer in her father; Ovarian cancer in her mother. ROS:   Please see the history of present illness.    All 14 point review of systems negative except as described per history of present illness  EKGs/Labs/Other Studies Reviewed:      Recent Labs: 11/06/2019: Hemoglobin 14.5; Platelets 215; TSH 1.490  Recent Lipid Panel No results found for: CHOL, TRIG, HDL, CHOLHDL, VLDL, LDLCALC, LDLDIRECT  Physical Exam:    VS:  BP 118/62   Pulse (!) 56   Temp 97.8 F (36.6 C)   Ht 5\' 7"  (1.702 m)   Wt 144 lb 9.6 oz (65.6 kg)   SpO2 97%   BMI 22.65 kg/m     Wt Readings from Last 3 Encounters:  12/08/19 144 lb 9.6 oz (65.6 kg)  11/06/19 145 lb (65.8 kg)  06/12/19 142 lb 12.8 oz (64.8 kg)      GEN:  Well nourished, well developed in no acute distress HEENT: Normal NECK: No JVD; No carotid bruits LYMPHATICS: No lymphadenopathy CARDIAC: RRR, no murmurs, no rubs, no gallops RESPIRATORY:  Clear to auscultation without rales, wheezing or rhonchi  ABDOMEN: Soft, non-tender, non-distended MUSCULOSKELETAL:  No edema; No deformity  SKIN: Warm and dry LOWER EXTREMITIES: no swelling NEUROLOGIC:  Alert and oriented x 3 PSYCHIATRIC:  Normal affect   ASSESSMENT:    1. Paroxysmal atrial fibrillation (HCC)   2. Pure hypercholesterolemia   3. Palpitations    PLAN:    In order of problems listed above:  1. Paroxysmal atrial fibrillation chads 2 vascular only 2.  She prefers not to be anticoagulated.  She is already on aspirin which I will continue.  I will switch her to long-acting form of beta-blocker.  I told him to have more palpitations to let me know. 2. .  Dyslipidemia will call primary care physician to get her fasting lipid profile 3. Palpitations: Related to atrial fibrillation much better with beta-blocker    Medication Adjustments/Labs and Tests Ordered: Current medicines are reviewed at length with the patient today.  Concerns regarding medicines are outlined above.  No orders of the defined types were placed in this encounter.  Medication changes: No orders of the defined types were placed in this encounter.   Signed, Park Liter, MD, [facility] 12/08/2019 2:35 PM    [place]

## 2019-12-08 NOTE — Addendum Note (Signed)
Addended by: Ashok Norris on: 12/08/2019 02:41 PM   Modules accepted: Orders

## 2020-02-07 DIAGNOSIS — L820 Inflamed seborrheic keratosis: Secondary | ICD-10-CM | POA: Diagnosis not present

## 2020-02-07 DIAGNOSIS — L578 Other skin changes due to chronic exposure to nonionizing radiation: Secondary | ICD-10-CM | POA: Diagnosis not present

## 2020-02-07 DIAGNOSIS — D2239 Melanocytic nevi of other parts of face: Secondary | ICD-10-CM | POA: Diagnosis not present

## 2020-02-07 DIAGNOSIS — D485 Neoplasm of uncertain behavior of skin: Secondary | ICD-10-CM | POA: Diagnosis not present

## 2020-03-14 ENCOUNTER — Ambulatory Visit: Payer: Medicare PPO | Admitting: Cardiology

## 2020-03-14 ENCOUNTER — Encounter: Payer: Self-pay | Admitting: Cardiology

## 2020-03-14 ENCOUNTER — Other Ambulatory Visit: Payer: Self-pay

## 2020-03-14 VITALS — BP 104/68 | HR 62 | Ht 67.0 in | Wt 144.4 lb

## 2020-03-14 DIAGNOSIS — E7800 Pure hypercholesterolemia, unspecified: Secondary | ICD-10-CM

## 2020-03-14 DIAGNOSIS — R002 Palpitations: Secondary | ICD-10-CM | POA: Diagnosis not present

## 2020-03-14 DIAGNOSIS — I480 Paroxysmal atrial fibrillation: Secondary | ICD-10-CM | POA: Diagnosis not present

## 2020-03-14 NOTE — Patient Instructions (Signed)

## 2020-03-14 NOTE — Progress Notes (Signed)
Cardiology Office Note:    Date:  03/14/2020   ID:  Carla Ross, DOB [birth year], MRN [id number]  PCP:  Ernestene Kiel, MD  Cardiologist:  Jenne Campus, MD    Referring MD: Ernestene Kiel, MD   Chief Complaint  Patient presents with  . Follow-up    3 MO FU   Doing much better  History of Present Illness:    Carla Ross is a 74 y.o. female with past medical history significant palpitations, recently she was recommended to have paroxysmal atrial fibrillation, her chads 2 Vascor equals only 2.  She elected to only take aspirin every day.  However I warned her that in November when she turns 74her chads 2 vascular will be 3 and at that time anticoagulation should be considered.  Denies having chest pain tightness squeezing pressure burning chest she said higher dose of beta-blocker make her feel significantly better.  Past Medical History:  Diagnosis Date  . A-fib ([place])   . Cancer [facility])     Past Surgical History:  Procedure Laterality Date  . ABDOMINAL HYSTERECTOMY    . BREAST EXCISIONAL BIOPSY Right    benign  . PILENDAL CYST SURGERY      Current Medications: Current Meds  Medication Sig  . aspirin EC 325 MG tablet Take 325 mg by mouth daily.  . Calcium Carbonate (CALTRATE 600 PO) Take by mouth daily.   . metoprolol succinate (TOPROL-XL) 50 MG 24 hr tablet Take 1 tablet (50 mg total) by mouth daily. Take with or immediately following a meal.  . Multiple Vitamin (MULTIVITAMIN) capsule Take 1 capsule by mouth daily.  . Omega-3 Fatty Acids (FISH OIL) 1200 MG CAPS Take 1,200 mg by mouth daily.      Allergies:   Statins   Social History   Socioeconomic History  . Marital status: Married    Spouse name: Not on file  . Number of children: Not on file  . Years of education: Not on file  . Highest education level: Not on file  Occupational History  . Not on file  Tobacco Use  . Smoking status: Never Smoker  . Smokeless tobacco: Never Used   Substance and Sexual Activity  . Alcohol use: Yes  . Drug use: No  . Sexual activity: Not on file  Other Topics Concern  . Not on file  Social History Narrative  . Not on file   Social Determinants of Health   Financial Resource Strain:   . Difficulty of Paying Living Expenses:   Food Insecurity:   . Worried About Charity fundraiser in the Last Year:   . Arboriculturist in the Last Year:   Transportation Needs:   . Film/video editor (Medical):   Marland Kitchen Lack of Transportation (Non-Medical):   Physical Activity:   . Days of Exercise per Week:   . Minutes of Exercise per Session:   Stress:   . Feeling of Stress :   Social Connections:   . Frequency of Communication with Friends and Family:   . Frequency of Social Gatherings with Friends and Family:   . Attends Religious Services:   . Active Member of Clubs or Organizations:   . Attends Archivist Meetings:   Marland Kitchen Marital Status:      Family History: The patient's family history includes Liver cancer in her father; Ovarian cancer in her mother. ROS:   Please see the history of present illness.    All 14 point review  of systems negative except as described per history of present illness  EKGs/Labs/Other Studies Reviewed:      Recent Labs: 11/06/2019: Hemoglobin 14.5; Platelets 215; TSH 1.490  Recent Lipid Panel No results found for: CHOL, TRIG, HDL, CHOLHDL, VLDL, LDLCALC, LDLDIRECT  Physical Exam:    VS:  BP 104/68   Pulse 62   Ht 5\' 7"  (1.702 m)   Wt 144 lb 6.4 oz (65.5 kg)   SpO2 98%   BMI 22.62 kg/m     Wt Readings from Last 3 Encounters:  03/14/20 144 lb 6.4 oz (65.5 kg)  12/08/19 144 lb 9.6 oz (65.6 kg)  11/06/19 145 lb (65.8 kg)     GEN:  Well nourished, well developed in no acute distress HEENT: Normal NECK: No JVD; No carotid bruits LYMPHATICS: No lymphadenopathy CARDIAC: RRR, no murmurs, no rubs, no gallops RESPIRATORY:  Clear to auscultation without rales, wheezing or rhonchi   ABDOMEN: Soft, non-tender, non-distended MUSCULOSKELETAL:  No edema; No deformity  SKIN: Warm and dry LOWER EXTREMITIES: no swelling NEUROLOGIC:  Alert and oriented x 3 PSYCHIATRIC:  Normal affect   ASSESSMENT:    1. Paroxysmal atrial fibrillation (HCC)   2. Palpitations   3. Pure hypercholesterolemia    PLAN:    In order of problems listed above:  1. Paroxysmal atrial fibrillation.  Denies having a palpitations, only aspirin however in November will start full anticoagulation. 2. Palpitations denies having any. 3. Dyslipidemia: Hypaque K PN showed LDL of 144-78 with HDL.  She is taking only fish oil for now.  Will continue discussion about intensification of her therapy.   Medication Adjustments/Labs and Tests Ordered: Current medicines are reviewed at length with the patient today.  Concerns regarding medicines are outlined above.  No orders of the defined types were placed in this encounter.  Medication changes: No orders of the defined types were placed in this encounter.   Signed, Park Liter, MD, [facility] 03/14/2020 3:34 PM    [place]

## 2020-03-18 DIAGNOSIS — R21 Rash and other nonspecific skin eruption: Secondary | ICD-10-CM | POA: Diagnosis not present

## 2020-03-22 DIAGNOSIS — L03221 Cellulitis of neck: Secondary | ICD-10-CM | POA: Diagnosis not present

## 2020-03-22 DIAGNOSIS — I480 Paroxysmal atrial fibrillation: Secondary | ICD-10-CM | POA: Diagnosis not present

## 2020-03-22 DIAGNOSIS — E785 Hyperlipidemia, unspecified: Secondary | ICD-10-CM | POA: Diagnosis not present

## 2020-03-22 DIAGNOSIS — Z91038 Other insect allergy status: Secondary | ICD-10-CM | POA: Diagnosis not present

## 2020-03-22 DIAGNOSIS — Z1331 Encounter for screening for depression: Secondary | ICD-10-CM | POA: Diagnosis not present

## 2020-04-01 DIAGNOSIS — W57XXXA Bitten or stung by nonvenomous insect and other nonvenomous arthropods, initial encounter: Secondary | ICD-10-CM | POA: Diagnosis not present

## 2020-04-01 DIAGNOSIS — M8589 Other specified disorders of bone density and structure, multiple sites: Secondary | ICD-10-CM | POA: Insufficient documentation

## 2020-04-01 DIAGNOSIS — N289 Disorder of kidney and ureter, unspecified: Secondary | ICD-10-CM | POA: Diagnosis not present

## 2020-04-01 DIAGNOSIS — S1086XA Insect bite of other specified part of neck, initial encounter: Secondary | ICD-10-CM | POA: Diagnosis not present

## 2020-04-01 DIAGNOSIS — I480 Paroxysmal atrial fibrillation: Secondary | ICD-10-CM | POA: Diagnosis not present

## 2020-04-11 ENCOUNTER — Other Ambulatory Visit: Payer: Self-pay | Admitting: Nurse Practitioner

## 2020-04-11 DIAGNOSIS — Z1231 Encounter for screening mammogram for malignant neoplasm of breast: Secondary | ICD-10-CM

## 2020-04-15 DIAGNOSIS — N289 Disorder of kidney and ureter, unspecified: Secondary | ICD-10-CM | POA: Diagnosis not present

## 2020-05-24 ENCOUNTER — Ambulatory Visit
Admission: RE | Admit: 2020-05-24 | Discharge: 2020-05-24 | Disposition: A | Discharge status: Home / Self Care | Payer: Medicare PPO | Source: Ambulatory Visit | Attending: Nurse Practitioner | Admitting: Nurse Practitioner

## 2020-05-24 ENCOUNTER — Other Ambulatory Visit: Payer: Self-pay

## 2020-05-24 DIAGNOSIS — Z1231 Encounter for screening mammogram for malignant neoplasm of breast: Secondary | ICD-10-CM | POA: Diagnosis not present

## 2020-06-01 ENCOUNTER — Other Ambulatory Visit: Payer: Self-pay | Admitting: Cardiology

## 2020-06-03 NOTE — Telephone Encounter (Signed)
Refill for Metoprolol Succinate ER 50 mg tablets sent to [facility]

## 2020-06-15 DIAGNOSIS — Z20828 Contact with and (suspected) exposure to other viral communicable diseases: Secondary | ICD-10-CM | POA: Diagnosis not present

## 2020-06-15 DIAGNOSIS — R0981 Nasal congestion: Secondary | ICD-10-CM | POA: Diagnosis not present

## 2020-06-15 DIAGNOSIS — R05 Cough: Secondary | ICD-10-CM | POA: Diagnosis not present

## 2020-07-04 DIAGNOSIS — L820 Inflamed seborrheic keratosis: Secondary | ICD-10-CM | POA: Diagnosis not present

## 2020-07-22 DIAGNOSIS — Z0000 Encounter for general adult medical examination without abnormal findings: Secondary | ICD-10-CM | POA: Diagnosis not present

## 2020-07-22 DIAGNOSIS — Z23 Encounter for immunization: Secondary | ICD-10-CM | POA: Diagnosis not present

## 2020-08-09 DIAGNOSIS — H2513 Age-related nuclear cataract, bilateral: Secondary | ICD-10-CM | POA: Diagnosis not present

## 2020-08-20 ENCOUNTER — Other Ambulatory Visit: Payer: Self-pay

## 2020-08-20 ENCOUNTER — Ambulatory Visit: Payer: Medicare PPO | Admitting: Cardiology

## 2020-08-20 ENCOUNTER — Encounter: Payer: Self-pay | Admitting: Cardiology

## 2020-08-20 VITALS — BP 118/64 | HR 54 | Ht 67.0 in | Wt 143.0 lb

## 2020-08-20 DIAGNOSIS — E7800 Pure hypercholesterolemia, unspecified: Secondary | ICD-10-CM

## 2020-08-20 DIAGNOSIS — R002 Palpitations: Secondary | ICD-10-CM

## 2020-08-20 DIAGNOSIS — I480 Paroxysmal atrial fibrillation: Secondary | ICD-10-CM | POA: Diagnosis not present

## 2020-08-20 NOTE — Patient Instructions (Signed)
Medication Instructions:  Your physician recommends that you continue on your current medications as directed. Please refer to the Current Medication list given to you today.  *If you need a refill on your cardiac medications before your next appointment, please call your pharmacy*   Lab Work: Your physician recommends that you return for lab work today: cbc, bmp, ifob, lipid   If you have labs (blood work) drawn today and your tests are completely normal, you will receive your results only by: Marland Kitchen MyChart Message (if you have MyChart) OR . A paper copy in the mail If you have any lab test that is abnormal or we need to change your treatment, we will call you to review the results.   Testing/Procedures: None   Follow-Up: At [facility], you and your health needs are our priority.  As part of our continuing mission to provide you with exceptional heart care, we have created designated Provider Care Teams.  These Care Teams include your primary Cardiologist (physician) and Advanced Practice Providers (APPs -  Physician Assistants and Nurse Practitioners) who all work together to provide you with the care you need, when you need it.  We recommend signing up for the patient portal called "MyChart".  Sign up information is provided on this After Visit Summary.  MyChart is used to connect with patients for Virtual Visits (Telemedicine).  Patients are able to view lab/test results, encounter notes, upcoming appointments, etc.  Non-urgent messages can be sent to your provider as well.   To learn more about what you can do with MyChart, go to [email or web address].    Your next appointment:   6 month(s)  The format for your next appointment:   In Person  Provider:   Jenne Campus, MD   Other Instructions

## 2020-08-20 NOTE — Progress Notes (Signed)
Cardiology Office Note:    Date:  08/20/2020   ID:  Carla Ross, DOB 25-Jan-1946, MRN [id number]  PCP:  Oretha Milch, NP  Cardiologist:  Jenne Campus, MD    Referring MD: Ernestene Kiel, MD   Chief Complaint  Patient presents with  . Follow-up  I am doing well  History of Present Illness:    Carla Ross is a 74 y.o. female with past medical history significant for paroxysmal atrial fibrillation.  Her chads 2 Vascor equals 2 however she is going to turn 74 for the next few weeks and her chads 2 Vas score will become 3.  Overall she is doing well, denies have any palpitations, no chest pain tightness squeezing pressure burning chest.  In the summertime she did have COVID-19 infection.  She was vaccinated with Pfizer vaccine however her course was selectively mild.  She does not required any hospitalization she was sick for about 3 days and then got better and now completely recovered.  Her husband got slightly sicker he ended receiving antibiotics however both of them recovered completely.  Past Medical History:  Diagnosis Date  . A-fib ([place])   . Cancer [facility])     Past Surgical History:  Procedure Laterality Date  . ABDOMINAL HYSTERECTOMY    . BREAST EXCISIONAL BIOPSY Right    benign  . PILENDAL CYST SURGERY      Current Medications: Current Meds  Medication Sig  . aspirin EC 325 MG tablet Take 325 mg by mouth daily.  . Calcium Carbonate (CALTRATE 600 PO) Take by mouth daily.   Marland Kitchen EPINEPHrine 0.3 mg/0.3 mL IJ SOAJ injection   . metoprolol succinate (TOPROL-XL) 50 MG 24 hr tablet TAKE 1 TABLET(50 MG) BY MOUTH DAILY WITH OR IMMEDIATELY FOLLOWING A MEAL  . Multiple Vitamin (MULTIVITAMIN) capsule Take 1 capsule by mouth daily.  . Omega-3 Fatty Acids (FISH OIL) 1200 MG CAPS Take 1,200 mg by mouth daily.      Allergies:   Statins   Social History   Socioeconomic History  . Marital status: Married    Spouse name: Not on file  . Number of children: Not on  file  . Years of education: Not on file  . Highest education level: Not on file  Occupational History  . Not on file  Tobacco Use  . Smoking status: Never Smoker  . Smokeless tobacco: Never Used  Vaping Use  . Vaping Use: Never used  Substance and Sexual Activity  . Alcohol use: Yes  . Drug use: No  . Sexual activity: Not on file  Other Topics Concern  . Not on file  Social History Narrative  . Not on file   Social Determinants of Health   Financial Resource Strain:   . Difficulty of Paying Living Expenses: Not on file  Food Insecurity:   . Worried About Charity fundraiser in the Last Year: Not on file  . Ran Out of Food in the Last Year: Not on file  Transportation Needs:   . Lack of Transportation (Medical): Not on file  . Lack of Transportation (Non-Medical): Not on file  Physical Activity:   . Days of Exercise per Week: Not on file  . Minutes of Exercise per Session: Not on file  Stress:   . Feeling of Stress : Not on file  Social Connections:   . Frequency of Communication with Friends and Family: Not on file  . Frequency of Social Gatherings with Friends and Family: Not  on file  . Attends Religious Services: Not on file  . Active Member of Clubs or Organizations: Not on file  . Attends Archivist Meetings: Not on file  . Marital Status: Not on file     Family History: The patient's family history includes Liver cancer in her father; Ovarian cancer in her mother. ROS:   Please see the history of present illness.    All 14 point review of systems negative except as described per history of present illness  EKGs/Labs/Other Studies Reviewed:      Recent Labs: 11/06/2019: Hemoglobin 14.5; Platelets 215; TSH 1.490  Recent Lipid Panel No results found for: CHOL, TRIG, HDL, CHOLHDL, VLDL, LDLCALC, LDLDIRECT  Physical Exam:    VS:  BP 118/64 (BP Location: Left Arm, Patient Position: Sitting, Cuff Size: Normal)   Pulse (!) 54   Ht 5\' 7"  (1.702 m)    Wt 143 lb (64.9 kg)   SpO2 98%   BMI 22.40 kg/m     Wt Readings from Last 3 Encounters:  08/20/20 143 lb (64.9 kg)  03/14/20 144 lb 6.4 oz (65.5 kg)  12/08/19 144 lb 9.6 oz (65.6 kg)     GEN:  Well nourished, well developed in no acute distress HEENT: Normal NECK: No JVD; No carotid bruits LYMPHATICS: No lymphadenopathy CARDIAC: RRR, no murmurs, no rubs, no gallops RESPIRATORY:  Clear to auscultation without rales, wheezing or rhonchi  ABDOMEN: Soft, non-tender, non-distended MUSCULOSKELETAL:  No edema; No deformity  SKIN: Warm and dry LOWER EXTREMITIES: no swelling NEUROLOGIC:  Alert and oriented x 3 PSYCHIATRIC:  Normal affect   ASSESSMENT:    1. Paroxysmal atrial fibrillation (HCC)   2. Pure hypercholesterolemia   3. Palpitations    PLAN:    In order of problems listed above:  1. Paroxysmal atrial fibrillation, we initiated conversation about anticoagulation as strongly recommended to start in about a month.  We will check CBC Chem-7 today, will check stool for guaiac if that is normal then will start Eliquis 5 mg twice daily with discontinuation of aspirin. 2. Dyslipidemia: She agreed to have fasting blood profile done which I will do and then I will calculate her 10 years risk and based on that decide about therapy. 3. Palpitation she is doing great from that point of view.  Seems like metoprolol is doing great job. 4. We did talk about healthy lifestyle we did talk about COVID-19 infection as well as vaccination.  She is debating if to take back booster or not.  She says she is getting a lot of information telling her that vaccines are back.  I told her this is an incorrect.  However I also stated that I am not sure exactly what to do in her situation since she did have a sickness with COVID-19 just few months ago.  I asked her to contact health department to find the answer.   Medication Adjustments/Labs and Tests Ordered: Current medicines are reviewed at length with  the patient today.  Concerns regarding medicines are outlined above.  No orders of the defined types were placed in this encounter.  Medication changes: No orders of the defined types were placed in this encounter.   Signed, Park Liter, MD, [facility] 08/20/2020 1:30 PM    [place]

## 2020-08-20 NOTE — Addendum Note (Signed)
Addended by: Senaida Ores on: 08/20/2020 01:44 PM   Modules accepted: Orders

## 2020-08-21 DIAGNOSIS — E7800 Pure hypercholesterolemia, unspecified: Secondary | ICD-10-CM | POA: Diagnosis not present

## 2020-08-21 DIAGNOSIS — R002 Palpitations: Secondary | ICD-10-CM | POA: Diagnosis not present

## 2020-08-21 DIAGNOSIS — I480 Paroxysmal atrial fibrillation: Secondary | ICD-10-CM | POA: Diagnosis not present

## 2020-08-22 LAB — CBC
Hematocrit: 37.4 % (ref 34.0–46.6)
Hemoglobin: 12.9 g/dL (ref 11.1–15.9)
MCH: 32.8 pg (ref 26.6–33.0)
MCHC: 34.5 g/dL (ref 31.5–35.7)
MCV: 95 fL (ref 79–97)
Platelets: 185 10*3/uL (ref 150–450)
RBC: 3.93 x10E6/uL (ref 3.77–5.28)
RDW: 11.8 % (ref 11.7–15.4)
WBC: 5.1 10*3/uL (ref 3.4–10.8)

## 2020-08-22 LAB — LIPID PANEL
Chol/HDL Ratio: 3.3 ratio (ref 0.0–4.4)
Cholesterol, Total: 222 mg/dL — ABNORMAL HIGH (ref 100–199)
HDL: 68 mg/dL (ref >39)
LDL Chol Calc (NIH): 132 mg/dL — ABNORMAL HIGH (ref 0–99)
Triglycerides: 124 mg/dL (ref 0–149)
VLDL Cholesterol Cal: 22 mg/dL (ref 5–40)

## 2020-08-22 LAB — FECAL OCCULT BLOOD, IMMUNOCHEMICAL: Fecal Occult Bld: NEGATIVE

## 2020-08-22 LAB — BASIC METABOLIC PANEL
BUN/Creatinine Ratio: 15 (ref 12–28)
BUN: 15 mg/dL (ref 8–27)
CO2: 26 mmol/L (ref 20–29)
Calcium: 9.3 mg/dL (ref 8.7–10.3)
Chloride: 105 mmol/L (ref 96–106)
Creatinine, Ser: 0.98 mg/dL (ref 0.57–1.00)
GFR calc Af Amer: 66 mL/min/{1.73_m2} (ref >59)
GFR calc non Af Amer: 57 mL/min/{1.73_m2} — ABNORMAL LOW (ref >59)
Glucose: 91 mg/dL (ref 65–99)
Potassium: 4.2 mmol/L (ref 3.5–5.2)
Sodium: 142 mmol/L (ref 134–144)

## 2020-08-23 ENCOUNTER — Telehealth: Payer: Self-pay | Admitting: Emergency Medicine

## 2020-08-23 DIAGNOSIS — Z79899 Other long term (current) drug therapy: Secondary | ICD-10-CM

## 2020-08-23 MED ORDER — APIXABAN 5 MG PO TABS: 5.0000 mg | ORAL_TABLET | Freq: Two times a day (BID) | ORAL | 2 refills | Status: DC

## 2020-08-23 NOTE — Telephone Encounter (Signed)
Called patient. Informed her of results and advised her to stop aspirin and start eliquis 5 mg twice daily. She was also informed to come back in 3 weeks to have labs redrawn. No further questions.

## 2020-08-23 NOTE — Telephone Encounter (Signed)
-----   Message from Park Liter, MD sent at 08/23/2020 12:53 PM EDT ----- Everything looks good to start anticoagulation, discontinue aspirin and start Eliquis 5 mg twice daily, she had to have H&H done within the next 3 weeks

## 2020-09-16 DIAGNOSIS — Z79899 Other long term (current) drug therapy: Secondary | ICD-10-CM | POA: Diagnosis not present

## 2020-09-17 LAB — CBC
Hematocrit: 38.1 % (ref 34.0–46.6)
Hemoglobin: 12.9 g/dL (ref 11.1–15.9)
MCH: 32.3 pg (ref 26.6–33.0)
MCHC: 33.9 g/dL (ref 31.5–35.7)
MCV: 95 fL (ref 79–97)
Platelets: 209 10*3/uL (ref 150–450)
RBC: 4 x10E6/uL (ref 3.77–5.28)
RDW: 11.4 % — ABNORMAL LOW (ref 11.7–15.4)
WBC: 10 10*3/uL (ref 3.4–10.8)

## 2020-09-21 DIAGNOSIS — J069 Acute upper respiratory infection, unspecified: Secondary | ICD-10-CM | POA: Diagnosis not present

## 2020-09-21 DIAGNOSIS — J3489 Other specified disorders of nose and nasal sinuses: Secondary | ICD-10-CM | POA: Diagnosis not present

## 2020-09-23 ENCOUNTER — Other Ambulatory Visit: Payer: Self-pay | Admitting: Cardiology

## 2020-09-23 MED ORDER — APIXABAN 5 MG PO TABS: 5.0000 mg | ORAL_TABLET | Freq: Two times a day (BID) | ORAL | 2 refills | Status: DC

## 2020-09-23 NOTE — Telephone Encounter (Signed)
Prescription refill request for Eliquis received. Indication: Atrial Fibrillation Last office visit: 08/2020  Carla Ross Scr: 0.98  08/2020 Age: 74 Weight: 64.9 kg  Prescription refilled

## 2020-09-23 NOTE — Telephone Encounter (Signed)
*  STAT* If patient is at the pharmacy, call can be transferred to refill team.   1. Which medications need to be refilled? (please list name of each medication and dose if known)  apixaban (ELIQUIS) 5 MG TABS tablet  2. Which pharmacy/location (including street and city if local pharmacy) is medication to be sent to? [facility] [phone] - [place], [address] AT [place]  3. Do they need a 30 day or 90 day supply? [place]

## 2020-10-03 DIAGNOSIS — Z0184 Encounter for antibody response examination: Secondary | ICD-10-CM | POA: Diagnosis not present

## 2020-10-08 DIAGNOSIS — I129 Hypertensive chronic kidney disease with stage 1 through stage 4 chronic kidney disease, or unspecified chronic kidney disease: Secondary | ICD-10-CM | POA: Diagnosis not present

## 2020-10-08 DIAGNOSIS — N183 Chronic kidney disease, stage 3 unspecified: Secondary | ICD-10-CM | POA: Diagnosis not present

## 2020-10-08 DIAGNOSIS — I480 Paroxysmal atrial fibrillation: Secondary | ICD-10-CM | POA: Diagnosis not present

## 2020-10-08 DIAGNOSIS — E7800 Pure hypercholesterolemia, unspecified: Secondary | ICD-10-CM | POA: Diagnosis not present

## 2020-10-08 HISTORY — DX: Hypertensive chronic kidney disease with stage 1 through stage 4 chronic kidney disease, or unspecified chronic kidney disease: I12.9

## 2020-10-08 HISTORY — DX: Chronic kidney disease, stage 3 unspecified: N18.30

## 2020-10-30 DIAGNOSIS — L821 Other seborrheic keratosis: Secondary | ICD-10-CM | POA: Diagnosis not present

## 2020-10-30 DIAGNOSIS — L820 Inflamed seborrheic keratosis: Secondary | ICD-10-CM | POA: Diagnosis not present

## 2020-12-10 ENCOUNTER — Telehealth: Payer: Self-pay | Admitting: Cardiology

## 2020-12-10 MED ORDER — METOPROLOL SUCCINATE ER 50 MG PO TB24: ORAL_TABLET | ORAL | 0 refills | Status: DC

## 2020-12-10 NOTE — Telephone Encounter (Signed)
Medication filled.  

## 2020-12-10 NOTE — Telephone Encounter (Signed)
Patient needs refill on Metoprolol sent to [place]. She is scheduled for a follow-up in May and stated she will not have enough pills to get her through.

## 2020-12-19 ENCOUNTER — Other Ambulatory Visit: Payer: Self-pay | Admitting: Cardiology

## 2021-02-25 DIAGNOSIS — I4891 Unspecified atrial fibrillation: Secondary | ICD-10-CM | POA: Insufficient documentation

## 2021-02-25 DIAGNOSIS — C801 Malignant (primary) neoplasm, unspecified: Secondary | ICD-10-CM | POA: Insufficient documentation

## 2021-02-28 ENCOUNTER — Other Ambulatory Visit: Payer: Self-pay

## 2021-02-28 ENCOUNTER — Encounter: Payer: Self-pay | Admitting: Cardiology

## 2021-02-28 ENCOUNTER — Ambulatory Visit: Payer: Medicare PPO | Admitting: Cardiology

## 2021-02-28 VITALS — BP 100/60 | HR 60 | Ht 67.0 in | Wt 144.0 lb

## 2021-02-28 DIAGNOSIS — E7800 Pure hypercholesterolemia, unspecified: Secondary | ICD-10-CM | POA: Diagnosis not present

## 2021-02-28 DIAGNOSIS — N183 Chronic kidney disease, stage 3 unspecified: Secondary | ICD-10-CM

## 2021-02-28 DIAGNOSIS — I129 Hypertensive chronic kidney disease with stage 1 through stage 4 chronic kidney disease, or unspecified chronic kidney disease: Secondary | ICD-10-CM

## 2021-02-28 DIAGNOSIS — I480 Paroxysmal atrial fibrillation: Secondary | ICD-10-CM | POA: Diagnosis not present

## 2021-02-28 NOTE — Progress Notes (Signed)
Cardiology Office Note:    Date:  02/28/2021   ID:  AROURA VASUDEVAN, DOB [birth year], MRN [id number]  PCP:  Maggie Schwalbe, PA-C  Cardiologist:  Jenne Campus, MD    Referring MD: Garnetta Buddy I, NP   Chief Complaint  Patient presents with  . Follow-up  I am doing fine  History of Present Illness:    Carla Ross is a 75 y.o. female with paroxysmal atrial fibrillation.  Her CHADS2 Vascor equals 3.  She is anticoagulated with Eliquis 5 mg twice daily which is appropriate to her clinical scenario weight and age.  Denies having any palpitations dizziness swelling of lower extremities overall she seems to be doing well.  Past Medical History:  Diagnosis Date  . A-fib ([place])   . 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 Benign hypertension with chronic kidney disease, stage III ([place]) 10/08/2020  . Cancer ([place])   . Palpitations 06/12/2019  . Paroxysmal atrial fibrillation ([place]) 04/10/2015   Overview:  Chads score 0, on aspirin alone.  Formatting of this note might be different from the original. Formatting of this note might be different from the original. Overview:  Chads score 0, on aspirin alone. Formatting of this note might be different from the original. Chads score 0, on aspirin alone.  . Pure hypercholesterolemia 04/10/2015    Past Surgical History:  Procedure Laterality Date  . ABDOMINAL HYSTERECTOMY    . BREAST EXCISIONAL BIOPSY Right    benign  . PILENDAL CYST SURGERY      Current Medications: Current Meds  Medication Sig  . Calcium Carbonate (CALTRATE 600 PO) Take 1 tablet by mouth daily.  Marland Kitchen ELIQUIS 5 MG TABS tablet TAKE 1 TABLET(5 MG) BY MOUTH TWICE DAILY (Patient taking differently: Take 5 mg by mouth 2 (two) times daily.)  . EPINEPHrine 0.3 mg/0.3 mL IJ SOAJ injection Inject 0.3 mg into the muscle as needed for anaphylaxis.  . metoprolol succinate (TOPROL-XL) 50 MG 24 hr tablet TAKE 1 TABLET(50 MG) BY MOUTH DAILY WITH OR IMMEDIATELY FOLLOWING A  MEAL (Patient taking differently: Take 50 mg by mouth daily. TAKE 1 TABLET(50 MG) BY MOUTH DAILY WITH OR IMMEDIATELY FOLLOWING A MEAL)  . Multiple Vitamin (MULTIVITAMIN) capsule Take 1 capsule by mouth daily. Unknown strength  . Omega-3 Fatty Acids (FISH OIL) 1200 MG CAPS Take 1,200 mg by mouth daily.      Allergies:   Other and Statins   Social History   Socioeconomic History  . Marital status: Married    Spouse name: Not on file  . Number of children: Not on file  . Years of education: Not on file  . Highest education level: Not on file  Occupational History  . Not on file  Tobacco Use  . Smoking status: Never Smoker  . Smokeless tobacco: Never Used  Vaping Use  . Vaping Use: Never used  Substance and Sexual Activity  . Alcohol use: Yes  . Drug use: No  . Sexual activity: Not on file  Other Topics Concern  . Not on file  Social History Narrative  . Not on file   Social Determinants of Health   Financial Resource Strain: Not on file  Food Insecurity: Not on file  Transportation Needs: Not on file  Physical Activity: Not on file  Stress: Not on file  Social Connections: Not on file     Family History: The patient's family history includes Liver cancer in her father; Ovarian cancer in her mother. ROS:  Please see the history of present illness.    All 14 point review of systems negative except as described per history of present illness  EKGs/Labs/Other Studies Reviewed:      Recent Labs: 08/21/2020: BUN 15; Creatinine, Ser 0.98; Potassium 4.2; Sodium 142 09/16/2020: Hemoglobin 12.9; Platelets 209  Recent Lipid Panel    Component Value Date/Time   CHOL 222 (H) 08/21/2020 0821   TRIG 124 08/21/2020 0821   HDL 68 08/21/2020 0821   CHOLHDL 3.3 08/21/2020 0821   LDLCALC 132 (H) 08/21/2020 0821    Physical Exam:    VS:  BP 100/60 (BP Location: Left Arm, Patient Position: Sitting)   Pulse 60   Ht 5\' 7"  (1.702 m)   Wt 144 lb (65.3 kg)   SpO2 95%   BMI  22.55 kg/m     Wt Readings from Last 3 Encounters:  02/28/21 144 lb (65.3 kg)  08/20/20 143 lb (64.9 kg)  03/14/20 144 lb 6.4 oz (65.5 kg)     GEN:  Well nourished, well developed in no acute distress HEENT: Normal NECK: No JVD; No carotid bruits LYMPHATICS: No lymphadenopathy CARDIAC: RRR, no murmurs, no rubs, no gallops RESPIRATORY:  Clear to auscultation without rales, wheezing or rhonchi  ABDOMEN: Soft, non-tender, non-distended MUSCULOSKELETAL:  No edema; No deformity  SKIN: Warm and dry LOWER EXTREMITIES: no swelling NEUROLOGIC:  Alert and oriented x 3 PSYCHIATRIC:  Normal affect   ASSESSMENT:    1. Paroxysmal atrial fibrillation (HCC)   2. Benign hypertension with chronic kidney disease, stage III (HCC)   3. Pure hypercholesterolemia    PLAN:    In order of problems listed above:  1. Paroxysmal atrial fibrillation.  Denies have any palpitation she is suppressed with AV nodal with metoprolol succinate 50 mg daily, anticoagulation is achieved with Eliquis 5 mg twice daily which I will continue. 2. Dyslipidemia I did review her K PN which show me LDL of 132 HDL 68 this is from November of last year that was done by primary care physician I did calculated 10 years predicted risk for coronary event it was 10.3.  This is intermediate.  I did talk to her about potentially starting medication she is not interested.  Therefore we will continue present management we did talk about healthy lifestyle with need to exercise and good diet. 3. Essential hypertension blood pressure seems to be well controlled continue present management.   Medication Adjustments/Labs and Tests Ordered: Current medicines are reviewed at length with the patient today.  Concerns regarding medicines are outlined above.  Orders Placed This Encounter  Procedures  . EKG 12-Lead   Medication changes: No orders of the defined types were placed in this encounter.   Signed, Park Liter, MD,  [facility] 02/28/2021 3:03 PM    [facility]

## 2021-02-28 NOTE — Patient Instructions (Signed)

## 2021-03-28 DIAGNOSIS — I129 Hypertensive chronic kidney disease with stage 1 through stage 4 chronic kidney disease, or unspecified chronic kidney disease: Secondary | ICD-10-CM | POA: Diagnosis not present

## 2021-03-28 DIAGNOSIS — N183 Chronic kidney disease, stage 3 unspecified: Secondary | ICD-10-CM | POA: Diagnosis not present

## 2021-03-28 DIAGNOSIS — Z8616 Personal history of COVID-19: Secondary | ICD-10-CM | POA: Diagnosis not present

## 2021-03-28 DIAGNOSIS — E7800 Pure hypercholesterolemia, unspecified: Secondary | ICD-10-CM | POA: Diagnosis not present

## 2021-03-28 DIAGNOSIS — Z0184 Encounter for antibody response examination: Secondary | ICD-10-CM | POA: Diagnosis not present

## 2021-04-01 DIAGNOSIS — Z131 Encounter for screening for diabetes mellitus: Secondary | ICD-10-CM | POA: Diagnosis not present

## 2021-04-01 DIAGNOSIS — I129 Hypertensive chronic kidney disease with stage 1 through stage 4 chronic kidney disease, or unspecified chronic kidney disease: Secondary | ICD-10-CM | POA: Diagnosis not present

## 2021-04-01 DIAGNOSIS — E7800 Pure hypercholesterolemia, unspecified: Secondary | ICD-10-CM | POA: Diagnosis not present

## 2021-04-01 DIAGNOSIS — I480 Paroxysmal atrial fibrillation: Secondary | ICD-10-CM | POA: Diagnosis not present

## 2021-04-01 DIAGNOSIS — Z79899 Other long term (current) drug therapy: Secondary | ICD-10-CM | POA: Diagnosis not present

## 2021-04-01 DIAGNOSIS — N183 Chronic kidney disease, stage 3 unspecified: Secondary | ICD-10-CM | POA: Diagnosis not present

## 2021-04-23 ENCOUNTER — Other Ambulatory Visit: Payer: Self-pay | Admitting: Podiatry

## 2021-04-23 ENCOUNTER — Other Ambulatory Visit: Payer: Self-pay | Admitting: Physician Assistant

## 2021-04-23 DIAGNOSIS — Z1231 Encounter for screening mammogram for malignant neoplasm of breast: Secondary | ICD-10-CM

## 2021-05-27 ENCOUNTER — Other Ambulatory Visit: Payer: Self-pay | Admitting: Cardiology

## 2021-06-16 ENCOUNTER — Ambulatory Visit
Admission: RE | Admit: 2021-06-16 | Discharge: 2021-06-16 | Disposition: A | Discharge status: Home / Self Care | Payer: Medicare PPO | Source: Ambulatory Visit | Attending: Physician Assistant | Admitting: Physician Assistant

## 2021-06-16 ENCOUNTER — Other Ambulatory Visit: Payer: Self-pay

## 2021-06-16 DIAGNOSIS — Z1231 Encounter for screening mammogram for malignant neoplasm of breast: Secondary | ICD-10-CM

## 2021-06-17 ENCOUNTER — Other Ambulatory Visit: Payer: Self-pay | Admitting: Cardiology

## 2021-07-01 DIAGNOSIS — L570 Actinic keratosis: Secondary | ICD-10-CM | POA: Diagnosis not present

## 2021-08-13 DIAGNOSIS — H25813 Combined forms of age-related cataract, bilateral: Secondary | ICD-10-CM | POA: Diagnosis not present

## 2021-08-25 ENCOUNTER — Other Ambulatory Visit: Payer: Self-pay | Admitting: Cardiology

## 2021-10-15 DIAGNOSIS — L821 Other seborrheic keratosis: Secondary | ICD-10-CM | POA: Diagnosis not present

## 2021-10-15 DIAGNOSIS — L570 Actinic keratosis: Secondary | ICD-10-CM | POA: Diagnosis not present

## 2021-10-15 DIAGNOSIS — L820 Inflamed seborrheic keratosis: Secondary | ICD-10-CM | POA: Diagnosis not present

## 2021-10-22 ENCOUNTER — Ambulatory Visit: Payer: Medicare PPO | Admitting: Cardiology

## 2021-10-22 ENCOUNTER — Other Ambulatory Visit: Payer: Self-pay

## 2021-10-22 ENCOUNTER — Encounter: Payer: Self-pay | Admitting: Cardiology

## 2021-10-22 VITALS — BP 130/76 | HR 62 | Ht 67.5 in | Wt 141.8 lb

## 2021-10-22 DIAGNOSIS — E7800 Pure hypercholesterolemia, unspecified: Secondary | ICD-10-CM | POA: Diagnosis not present

## 2021-10-22 DIAGNOSIS — I480 Paroxysmal atrial fibrillation: Secondary | ICD-10-CM | POA: Diagnosis not present

## 2021-10-22 DIAGNOSIS — R002 Palpitations: Secondary | ICD-10-CM

## 2021-10-22 NOTE — Patient Instructions (Signed)

## 2021-10-22 NOTE — Progress Notes (Signed)
Cardiology Office Note:    Date:  10/22/2021   ID:  WISDOM SEYBOLD, DOB Nov 11, 1945, MRN [id number]  PCP:  Maggie Schwalbe, PA-C  Cardiologist:  Jenne Campus, MD    Referring MD: Maggie Schwalbe, PA-C   Chief Complaint  Patient presents with   Follow-up  I am doing well  History of Present Illness:    Carla Ross is a 76 y.o. female with past medical history significant for paroxysmal atrial fibrillation, her CHADS2 Vascor equals 3 she is anticoagulated which I will continue, palpitations, dyslipidemia.  She comes today to my office for follow-up.  Overall she is doing very well.  She denies have any chest pain tightness squeezing pressure burning chest.  Very rare short lasting palpitations when she dehydrated.  She exercised on the regular basis she does yoga as well as do some weightlifting.  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  Formatting of this note might be different from the original. Formatting of this note might be different from the original. Overview:  Chads score 0, on aspirin alone. Formatting of this note might be different from the original. Chads score 0, on aspirin alone.   Pure hypercholesterolemia 04/10/2015    Past Surgical History:  Procedure Laterality Date   ABDOMINAL HYSTERECTOMY     BREAST EXCISIONAL BIOPSY Right    benign   PILENDAL CYST SURGERY      Current Medications: Current Meds  Medication Sig   apixaban (ELIQUIS) 5 MG TABS tablet Take 5 mg by mouth 2 (two) times daily.   Calcium Carbonate (CALTRATE 600 PO) Take 1 tablet by mouth daily.   EPINEPHrine 0.3 mg/0.3 mL IJ SOAJ injection Inject 0.3 mg into the muscle as needed for anaphylaxis.    metoprolol tartrate (LOPRESSOR) 50 MG tablet Take 50 mg by mouth daily.   Multiple Vitamin (MULTIVITAMIN) capsule Take 1 capsule by mouth daily. Unknown strength     Allergies:   Other and Statins   Social History   Socioeconomic History   Marital status: Married    Spouse name: Not on file   Number of children: Not on file   Years of education: Not on file   Highest education level: Not on file  Occupational History   Not on file  Tobacco Use   Smoking status: Never   Smokeless tobacco: Never  Vaping Use   Vaping Use: Never used  Substance and Sexual Activity   Alcohol use: Yes   Drug use: No   Sexual activity: Not on file  Other Topics Concern   Not on file  Social History Narrative   Not on file   Social Determinants of Health   Financial Resource Strain: Not on file  Food Insecurity: Not on file  Transportation Needs: Not on file  Physical Activity: Not on file  Stress: Not on file  Social Connections: Not on file     Family History: The patient's family history includes Liver cancer in her father; Ovarian cancer in her mother. ROS:   Please see the history of present illness.    All 14 point review of systems negative except as described per history of present illness  EKGs/Labs/Other Studies Reviewed:  Recent Labs: No results found for requested labs within last 8760 hours.  Recent Lipid Panel    Component Value Date/Time   CHOL 222 (H) 08/21/2020 0821   TRIG 124 08/21/2020 0821   HDL 68 08/21/2020 0821   CHOLHDL 3.3 08/21/2020 0821   LDLCALC 132 (H) 08/21/2020 0821    Physical Exam:    VS:  BP 130/76 (BP Location: Left Arm, Patient Position: Sitting)    Pulse 62    Ht 5' 7.5" (1.715 m)    Wt 141 lb 12.8 oz (64.3 kg)    SpO2 96%    BMI 21.88 kg/m     Wt Readings from Last 3 Encounters:  10/22/21 141 lb 12.8 oz (64.3 kg)  02/28/21 144 lb (65.3 kg)  08/20/20 143 lb (64.9 kg)     GEN:  Well nourished, well developed in no acute  distress HEENT: Normal NECK: No JVD; No carotid bruits LYMPHATICS: No lymphadenopathy CARDIAC: RRR, no murmurs, no rubs, no gallops RESPIRATORY:  Clear to auscultation without rales, wheezing or rhonchi  ABDOMEN: Soft, non-tender, non-distended MUSCULOSKELETAL:  No edema; No deformity  SKIN: Warm and dry LOWER EXTREMITIES: no swelling NEUROLOGIC:  Alert and oriented x 3 PSYCHIATRIC:  Normal affect   ASSESSMENT:    1. Paroxysmal atrial fibrillation (HCC)   2. Palpitations   3. Pure hypercholesterolemia    PLAN:    In order of problems listed above:  Paroxysmal atrial fibrillation denies having any recent palpitations.  She is anticoagulated which I will continue. Palpitations are very rare she is on beta-blocker metoprolol 50 mg daily which I will continue Dyslipidemia: Need to have every single time discussion with him and strongly recommended her to go on statin however she does not want to do that she tells me today she does not want to go on any more medications last fasting lipid profile have is from 2021 November showing LDL of 132 HDL 68.   Medication Adjustments/Labs and Tests Ordered: Current medicines are reviewed at length with the patient today.  Concerns regarding medicines are outlined above.  No orders of the defined types were placed in this encounter.  Medication changes: No orders of the defined types were placed in this encounter.   Signed, Park Liter, MD, [facility] 10/22/2021 1:55 PM    [place]

## 2021-12-25 ENCOUNTER — Other Ambulatory Visit: Payer: Self-pay

## 2021-12-25 ENCOUNTER — Telehealth: Payer: Self-pay | Admitting: Cardiology

## 2021-12-25 MED ORDER — APIXABAN 5 MG PO TABS: 5.0000 mg | ORAL_TABLET | Freq: Two times a day (BID) | ORAL | 5 refills | Status: DC

## 2021-12-25 NOTE — Telephone Encounter (Signed)
Prescription refill request for Eliquis received. ?Indication:Afib ?Last office visit:1/23 ?Scr:1.0 ?Age: 76 ?Weight:64.3 kg ? ?Prescription refilled ? ?

## 2021-12-25 NOTE — Telephone Encounter (Signed)
?*  STAT* If patient is at the pharmacy, call can be transferred to refill team. ? ? ?1. Which medications need to be refilled? (please list name of each medication and dose if known)  ?apixaban (ELIQUIS) 5 MG TABS tablet ? ?2. Which pharmacy/location (including street and city if local pharmacy) is medication to be sent to? ?[facility] [phone] - [place], [address] AT [place] ? ?3. Do they need a 30 day or 90 day supply? 90 with refills ? ?Patient is out of medication  ?

## 2022-01-15 DIAGNOSIS — H00011 Hordeolum externum right upper eyelid: Secondary | ICD-10-CM | POA: Diagnosis not present

## 2022-01-27 DIAGNOSIS — D485 Neoplasm of uncertain behavior of skin: Secondary | ICD-10-CM | POA: Diagnosis not present

## 2022-05-14 ENCOUNTER — Other Ambulatory Visit: Payer: Self-pay | Admitting: Nurse Practitioner

## 2022-05-14 DIAGNOSIS — Z1231 Encounter for screening mammogram for malignant neoplasm of breast: Secondary | ICD-10-CM

## 2022-05-22 ENCOUNTER — Other Ambulatory Visit: Payer: Self-pay | Admitting: Cardiology

## 2022-06-14 ENCOUNTER — Other Ambulatory Visit: Payer: Self-pay | Admitting: Cardiology

## 2022-06-15 NOTE — Telephone Encounter (Signed)
Prescription refill request for Eliquis received. Indication:Afib Last office visit:1/23 Scr:0.9 Age: 76 Weight:64.3 kg  Prescription refilled

## 2022-06-17 ENCOUNTER — Ambulatory Visit
Admission: RE | Admit: 2022-06-17 | Discharge: 2022-06-17 | Disposition: A | Discharge status: Home / Self Care | Payer: Medicare PPO | Source: Ambulatory Visit | Attending: Nurse Practitioner | Admitting: Nurse Practitioner

## 2022-06-17 ENCOUNTER — Other Ambulatory Visit: Payer: Self-pay | Admitting: Physician Assistant

## 2022-06-17 DIAGNOSIS — Z1231 Encounter for screening mammogram for malignant neoplasm of breast: Secondary | ICD-10-CM

## 2022-06-23 ENCOUNTER — Other Ambulatory Visit: Payer: Self-pay | Admitting: Physician Assistant

## 2022-06-23 ENCOUNTER — Other Ambulatory Visit: Payer: Self-pay | Admitting: Nurse Practitioner

## 2022-07-01 ENCOUNTER — Other Ambulatory Visit: Payer: Self-pay | Admitting: Physician Assistant

## 2022-07-01 DIAGNOSIS — Z780 Asymptomatic menopausal state: Secondary | ICD-10-CM

## 2022-07-21 IMAGING — MG DIGITAL SCREENING BILAT W/ TOMO W/ CAD
8 series · 9 of 24 positions shown · non-contrast
Comparison: Previous exam(s).

CLINICAL DATA: Screening.

EXAM:
DIGITAL SCREENING BILATERAL MAMMOGRAM WITH TOMO AND CAD

[R MLO synth-2D]
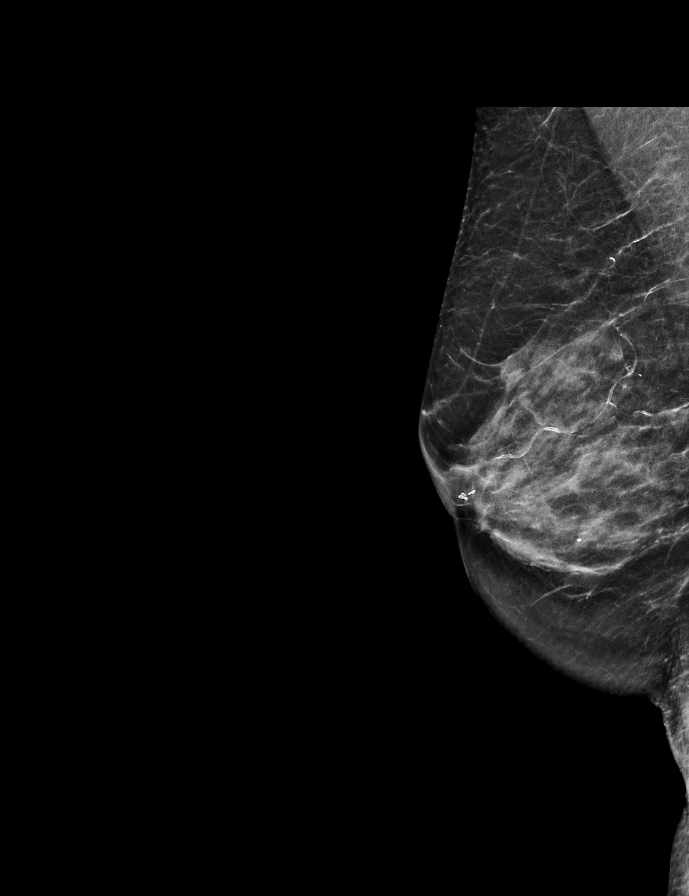

[R CC synth-2D]
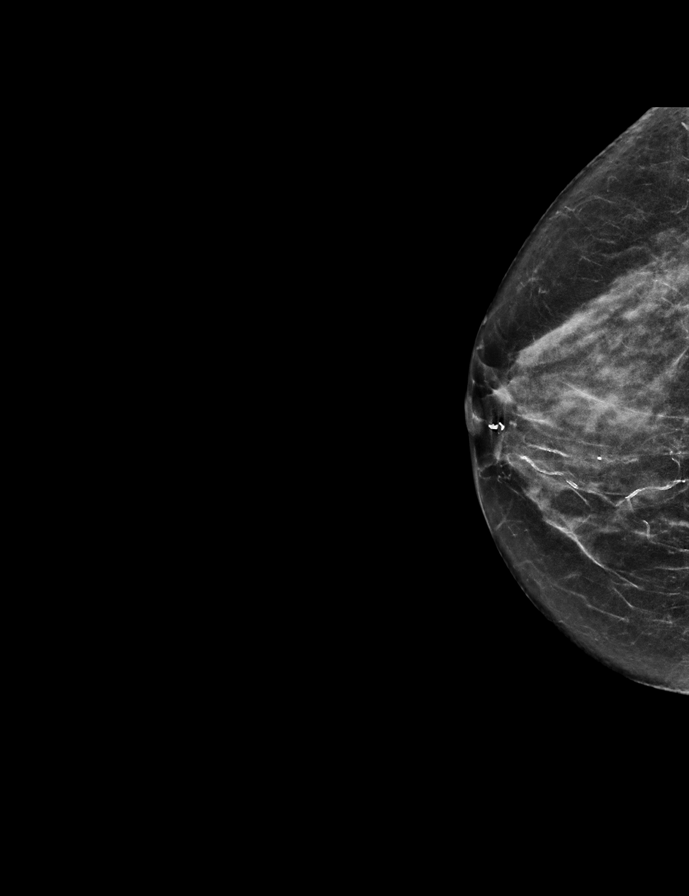

[L MLO synth-2D]
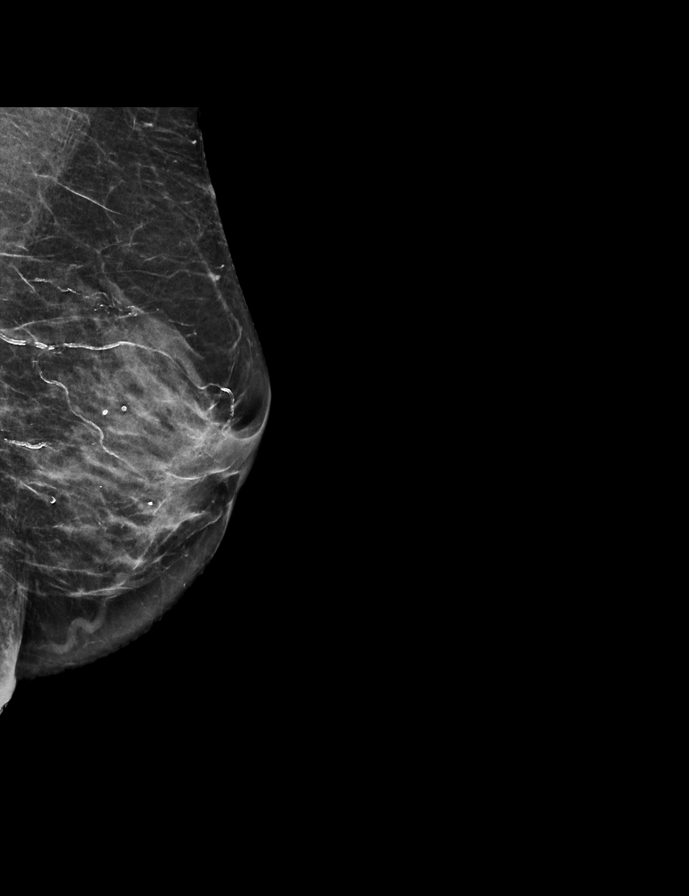

[L CC synth-2D]
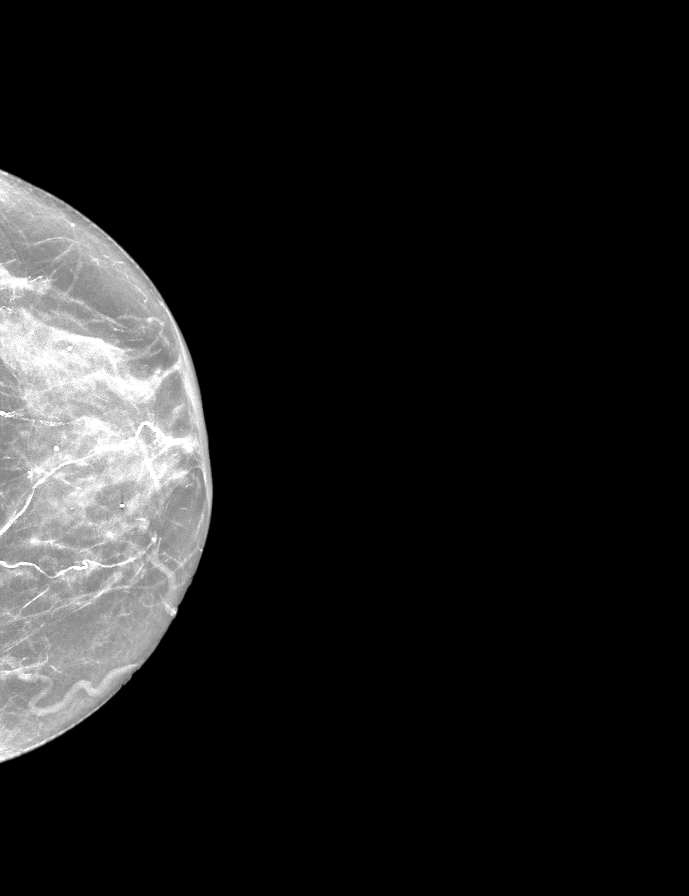

[L CC tomo · 2 of 48 frames shown]
[frame 16/48]
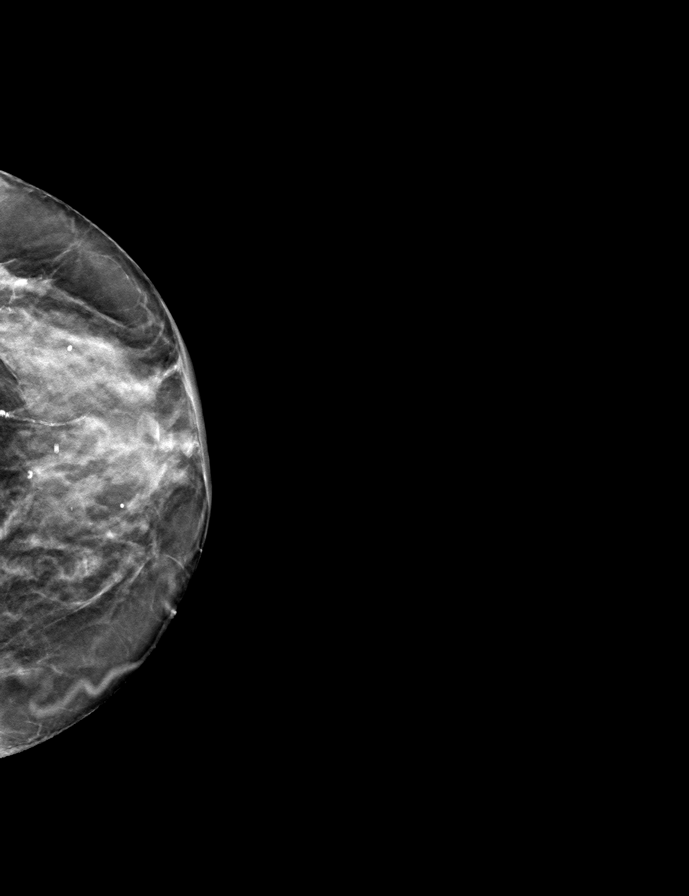
[frame 25/48]
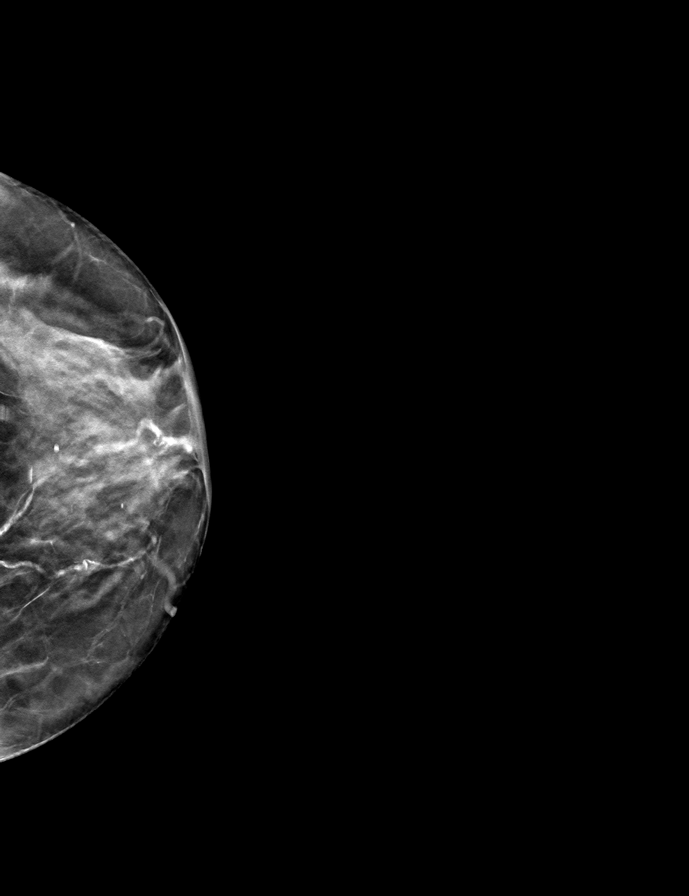

[R CC tomo · tomo slice 27/53.0]
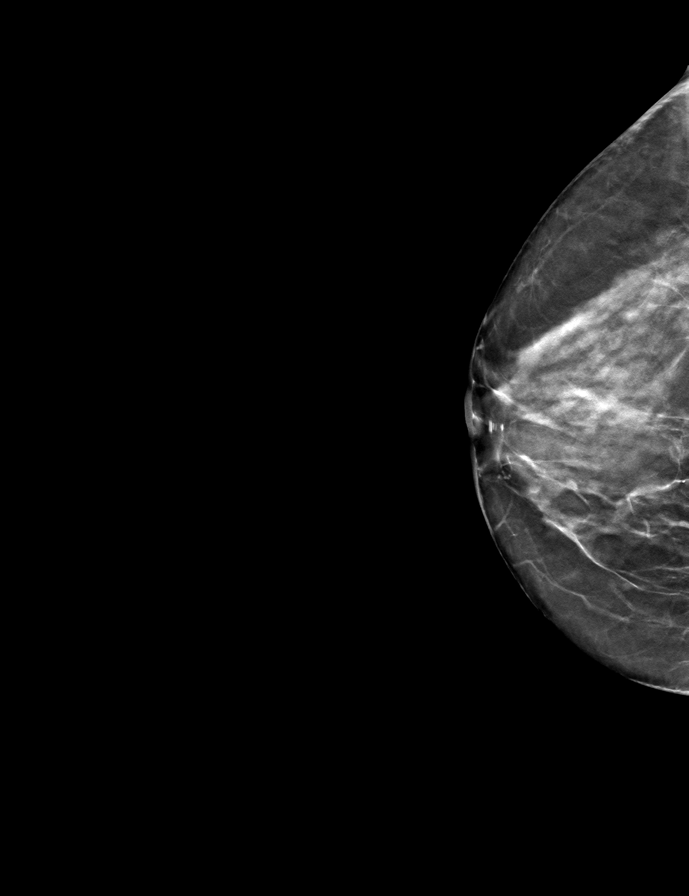

[L MLO tomo · tomo slice 27/52.0]
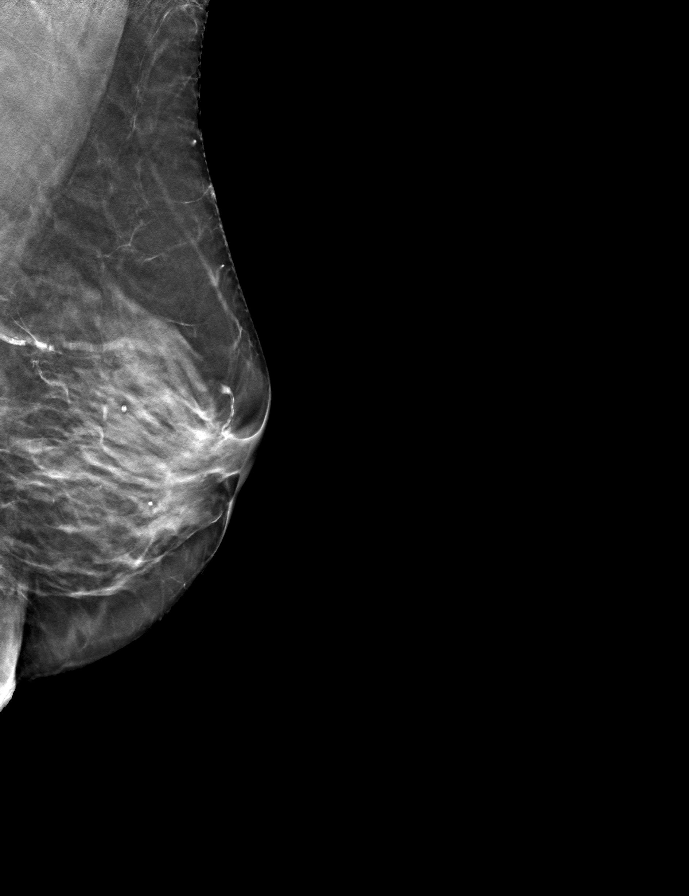

[R MLO tomo · tomo slice 27/52.0]
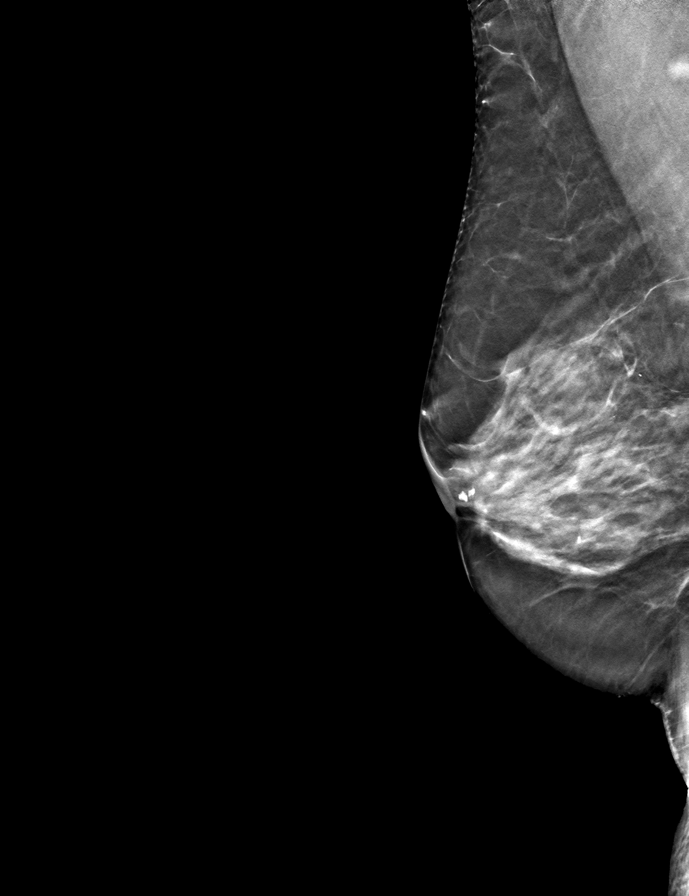

[9 of 24 positions shown; findings below may reference images not displayed]

ACR Breast Density Category c: The breast tissue is heterogeneously
dense, which may obscure small masses.
FINDINGS: There are no findings suspicious for malignancy. Images were
processed with CAD.
IMPRESSION: No mammographic evidence of malignancy. A result letter of this
screening mammogram will be mailed directly to the patient.

RECOMMENDATION:
Screening mammogram in one year. (Code:FT-U-LHB)

BI-RADS CATEGORY  1: Negative.

## 2022-08-06 ENCOUNTER — Encounter: Payer: Self-pay | Admitting: Cardiology

## 2022-08-06 ENCOUNTER — Ambulatory Visit: Payer: Medicare PPO | Attending: Cardiology | Admitting: Cardiology

## 2022-08-06 VITALS — BP 108/62 | HR 58 | Ht 67.5 in | Wt 142.8 lb

## 2022-08-06 DIAGNOSIS — R002 Palpitations: Secondary | ICD-10-CM | POA: Diagnosis not present

## 2022-08-06 DIAGNOSIS — E7800 Pure hypercholesterolemia, unspecified: Secondary | ICD-10-CM

## 2022-08-06 DIAGNOSIS — I480 Paroxysmal atrial fibrillation: Secondary | ICD-10-CM | POA: Diagnosis not present

## 2022-08-06 NOTE — Patient Instructions (Signed)

## 2022-08-06 NOTE — Progress Notes (Signed)
Cardiology Office Note:    Date:  08/06/2022   ID:  Carla Ross, DOB July 03, 1946, MRN [id number]  PCP:  Oretha Milch, NP  Cardiologist:  Jenne Campus, MD    Referring MD: Maggie Schwalbe, PA-C   Chief Complaint  Patient presents with   Follow-up  Doing well  History of Present Illness:    Carla Ross is a 76 y.o. female past medical history significant for paroxysmal atrial fibrillation, CHADS2 Vascor equals 3, she is anticoagulant with Eliquis and dose is appropriate, dyslipidemia, palpitations.  Comes today to my office for follow-up.  Overall doing very well.  Denies have any chest pain tightness squeezing pressure burning chest no palpitations dizziness swelling of lower extremities.  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  Formatting of this note might be different from the original. Formatting of this note might be different from the original. Overview:  Chads score 0, on aspirin alone. Formatting of this note might be different from the original. Chads score 0, on aspirin alone.   Pure hypercholesterolemia 04/10/2015    Past Surgical History:  Procedure Laterality Date   ABDOMINAL HYSTERECTOMY     BREAST EXCISIONAL BIOPSY Right    benign   PILENDAL CYST SURGERY      Current Medications: Current Meds  Medication Sig   Calcium Carbonate (CALTRATE 600 PO) Take 1 tablet by mouth daily.   ELIQUIS 5 MG TABS tablet TAKE 1 TABLET(5 MG) BY MOUTH TWICE DAILY (Patient taking differently: Take 5 mg by mouth 2 (two) times daily.)   EPINEPHrine 0.3 mg/0.3 mL IJ SOAJ injection Inject 0.3 mg into the muscle as needed for anaphylaxis.   metoprolol succinate (TOPROL-XL) 50 MG 24 hr tablet  TAKE 1 TABLET(50 MG) BY MOUTH DAILY WITH OR IMMEDIATELY FOLLOWING A MEAL   Multiple Vitamin (MULTIVITAMIN) capsule Take 1 capsule by mouth daily. Unknown strength   Omega-3 Fatty Acids (FISH OIL PO) Take 1 capsule by mouth daily.   [DISCONTINUED] metoprolol tartrate (LOPRESSOR) 50 MG tablet Take 50 mg by mouth daily.     Allergies:   Other and Statins   Social History   Socioeconomic History   Marital status: Married    Spouse name: Not on file   Number of children: Not on file   Years of education: Not on file   Highest education level: Not on file  Occupational History   Not on file  Tobacco Use   Smoking status: Never   Smokeless tobacco: Never  Vaping Use   Vaping Use: Never used  Substance and Sexual Activity   Alcohol use: Yes   Drug use: No   Sexual activity: Not on file  Other Topics Concern   Not on file  Social History Narrative   Not on file   Social Determinants of Health   Financial Resource Strain: Not on file  Food Insecurity: Not on file  Transportation Needs: Not on file  Physical Activity: Not on file  Stress: Not on file  Social Connections: Not on file     Family History: The patient's family history includes Liver cancer in her father; Ovarian cancer in her mother. ROS:   Please see the history of  present illness.    All 14 point review of systems negative except as described per history of present illness  EKGs/Labs/Other Studies Reviewed:      Recent Labs: No results found for requested labs within last 365 days.  Recent Lipid Panel    Component Value Date/Time   CHOL 222 (H) 08/21/2020 0821   TRIG 124 08/21/2020 0821   HDL 68 08/21/2020 0821   CHOLHDL 3.3 08/21/2020 0821   LDLCALC 132 (H) 08/21/2020 0821    Physical Exam:    VS:  BP 108/62 (BP Location: Left Arm, Patient Position: Sitting)   Pulse (!) 58   Ht 5' 7.5" (1.715 m)   Wt 142 lb 12.8 oz (64.8 kg)   SpO2 94%   BMI 22.04 kg/m     Wt Readings from Last 3  Encounters:  08/06/22 142 lb 12.8 oz (64.8 kg)  10/22/21 141 lb 12.8 oz (64.3 kg)  02/28/21 144 lb (65.3 kg)     GEN:  Well nourished, well developed in no acute distress HEENT: Normal NECK: No JVD; No carotid bruits LYMPHATICS: No lymphadenopathy CARDIAC: RRR, no murmurs, no rubs, no gallops RESPIRATORY:  Clear to auscultation without rales, wheezing or rhonchi  ABDOMEN: Soft, non-tender, non-distended MUSCULOSKELETAL:  No edema; No deformity  SKIN: Warm and dry LOWER EXTREMITIES: no swelling NEUROLOGIC:  Alert and oriented x 3 PSYCHIATRIC:  Normal affect   ASSESSMENT:    1. Paroxysmal atrial fibrillation (HCC)   2. Pure hypercholesterolemia   3. Palpitations    PLAN:    In order of problems listed above:  Paroxysmal atrial fibrillation anticoagulated which I continue denies having any episodes.  She is on Toprol-XL which I will continue. Dyslipidemia I did review K PN which show me data from 2021 with LDL 132 HDL 68.  She is telling me that she just had test done 3 months ago by her primary care physician we will try to get a copy of it she was told that her good cholesterol is high but her bad cholesterol slightly elevated she was told to exercise and be on diet and she is actually scheduled to have her cholesterol rechecked within the next 3 months.  We will wait for results of the test. Palpitations denies having any   Medication Adjustments/Labs and Tests Ordered: Current medicines are reviewed at length with the patient today.  Concerns regarding medicines are outlined above.  No orders of the defined types were placed in this encounter.  Medication changes: No orders of the defined types were placed in this encounter.   Signed, Park Liter, MD, [facility] 08/06/2022 9:51 AM    [place]

## 2022-11-05 ENCOUNTER — Ambulatory Visit
Admission: RE | Admit: 2022-11-05 | Discharge: 2022-11-05 | Disposition: A | Discharge status: Home / Self Care | Payer: Medicare PPO | Source: Ambulatory Visit | Attending: Physician Assistant | Admitting: Physician Assistant

## 2022-11-05 DIAGNOSIS — Z780 Asymptomatic menopausal state: Secondary | ICD-10-CM

## 2022-12-14 ENCOUNTER — Telehealth: Payer: Self-pay | Admitting: Cardiology

## 2022-12-14 ENCOUNTER — Other Ambulatory Visit: Payer: Self-pay

## 2022-12-14 MED ORDER — APIXABAN 5 MG PO TABS: 5.0000 mg | ORAL_TABLET | Freq: Two times a day (BID) | ORAL | 3 refills | Status: DC

## 2022-12-14 NOTE — Telephone Encounter (Signed)
Rx Eliquis '5mg'$  Bid #180 ref x 3 sent to [facility] [address]

## 2022-12-14 NOTE — Telephone Encounter (Signed)
Pt c/o medication issue:  1. Name of Medication: ELIQUIS 5 MG TABS tablet   2. How are you currently taking this medication (dosage and times per day)? As written  3. Are you having a reaction (difficulty breathing--STAT)? No   4. What is your medication issue? Please send in a new three, 90 day prescription to  [facility], [address] AT [place]

## 2022-12-28 DIAGNOSIS — Z789 Other specified health status: Secondary | ICD-10-CM | POA: Insufficient documentation

## 2022-12-28 DIAGNOSIS — Z8616 Personal history of COVID-19: Secondary | ICD-10-CM | POA: Insufficient documentation

## 2023-02-16 ENCOUNTER — Other Ambulatory Visit: Payer: Self-pay | Admitting: Cardiology

## 2023-05-14 ENCOUNTER — Other Ambulatory Visit: Payer: Self-pay | Admitting: Internal Medicine

## 2023-05-14 DIAGNOSIS — Z1231 Encounter for screening mammogram for malignant neoplasm of breast: Secondary | ICD-10-CM

## 2023-05-27 ENCOUNTER — Ambulatory Visit: Payer: Medicare PPO | Attending: Cardiology | Admitting: Cardiology

## 2023-05-27 ENCOUNTER — Encounter: Payer: Self-pay | Admitting: Cardiology

## 2023-05-27 VITALS — BP 130/72 | HR 61 | Ht 67.0 in | Wt 141.2 lb

## 2023-05-27 DIAGNOSIS — I129 Hypertensive chronic kidney disease with stage 1 through stage 4 chronic kidney disease, or unspecified chronic kidney disease: Secondary | ICD-10-CM | POA: Diagnosis not present

## 2023-05-27 DIAGNOSIS — R002 Palpitations: Secondary | ICD-10-CM | POA: Diagnosis not present

## 2023-05-27 DIAGNOSIS — I480 Paroxysmal atrial fibrillation: Secondary | ICD-10-CM | POA: Diagnosis not present

## 2023-05-27 DIAGNOSIS — E7800 Pure hypercholesterolemia, unspecified: Secondary | ICD-10-CM | POA: Diagnosis not present

## 2023-05-27 DIAGNOSIS — Z8249 Family history of ischemic heart disease and other diseases of the circulatory system: Secondary | ICD-10-CM

## 2023-05-27 DIAGNOSIS — N183 Chronic kidney disease, stage 3 unspecified: Secondary | ICD-10-CM

## 2023-05-27 MED ORDER — METOPROLOL SUCCINATE ER 50 MG PO TB24: 50.0000 mg | ORAL_TABLET | Freq: Every day | ORAL | 3 refills | Status: DC

## 2023-05-27 NOTE — Progress Notes (Signed)
Cardiology Office Note:    Date:  05/27/2023   ID:  Carla Ross, DOB 11-Jan-1946, MRN [id number]  PCP:  Audie Pinto, FNP  Cardiologist:  Gypsy Balsam, MD    Referring MD: Francee Piccolo I, NP   Chief Complaint  Patient presents with   Follow-up    History of Present Illness:    Carla Ross is a 77 y.o. female with past medical history significant for paroxysmal atrial fibrillation, CHADS2 Vascor equals 3 she is anticoagulated, and dose of Eliquis is appropriate, dyslipidemia, palpitations.  Comes today to months of follow-up.  Overall doing very well.  He is she denies have any chest pain tightness squeezing pressure burning chest she does what she wants to do with no difficulties.  Past Medical History:  Diagnosis Date   A-fib [facility])    Adhesive capsulitis of right shoulder 06/14/2018   Benign hypertension with chronic kidney disease, stage III (HCC) 10/08/2020   Cancer (HCC)    Palpitations 06/12/2019   Paroxysmal atrial fibrillation (HCC) 04/10/2015   Overview:  Chads score 0, on aspirin alone.  Formatting of this note might be different from the original. Formatting of this note might be different from the original. Overview:  Chads score 0, on aspirin alone. Formatting of this note might be different from the original. Chads score 0, on aspirin alone.   Pure hypercholesterolemia 04/10/2015    Past Surgical History:  Procedure Laterality Date   ABDOMINAL HYSTERECTOMY     BREAST EXCISIONAL BIOPSY Right    benign   PILENDAL CYST SURGERY      Current Medications: Current Meds  Medication Sig   apixaban (ELIQUIS) 5 MG TABS tablet Take 1 tablet (5 mg total) by mouth 2 (two) times daily.   Calcium Carbonate (CALTRATE 600 PO) Take 1 tablet by mouth daily.   EPINEPHrine 0.3 mg/0.3 mL IJ SOAJ injection Inject 0.3 mg into the muscle as needed for anaphylaxis.   metoprolol succinate (TOPROL-XL) 50 MG 24 hr tablet Take 1 tablet (50 mg total) by mouth daily.   Multiple  Vitamin (MULTIVITAMIN) capsule Take 1 capsule by mouth daily. Unknown strength   [DISCONTINUED] Omega-3 Fatty Acids (FISH OIL PO) Take 1 capsule by mouth daily.     Allergies:   Other and Statins   Social History   Socioeconomic History   Marital status: Married    Spouse name: Not on file   Number of children: Not on file   Years of education: Not on file   Highest education level: Not on file  Occupational History   Not on file  Tobacco Use   Smoking status: Never   Smokeless tobacco: Never  Vaping Use   Vaping status: Never Used  Substance and Sexual Activity   Alcohol use: Yes   Drug use: No   Sexual activity: Not on file  Other Topics Concern   Not on file  Social History Narrative   Not on file   Social Determinants of Health   Financial Resource Strain: Not on file  Food Insecurity: Not on file  Transportation Needs: Not on file  Physical Activity: Not on file  Stress: Not on file  Social Connections: Not on file     Family History: The patient's family history includes Liver cancer in her father; Ovarian cancer in her mother. ROS:   Please see the history of present illness.    All 14 point review of systems negative except as described per history of present illness  EKGs/Labs/Other Studies Reviewed:         Recent Labs: No results found for requested labs within last 365 days.  Recent Lipid Panel    Component Value Date/Time   CHOL 222 (H) 08/21/2020 0821   TRIG 124 08/21/2020 0821   HDL 68 08/21/2020 0821   CHOLHDL 3.3 08/21/2020 0821   LDLCALC 132 (H) 08/21/2020 0821    Physical Exam:    VS:  BP 130/72 (BP Location: Left Arm, Patient Position: Sitting)   Pulse 61   Ht 5\' 7"  (1.702 m)   Wt 141 lb 3.2 oz (64 kg)   SpO2 94%   BMI 22.12 kg/m     Wt Readings from Last 3 Encounters:  05/27/23 141 lb 3.2 oz (64 kg)  08/06/22 142 lb 12.8 oz (64.8 kg)  10/22/21 141 lb 12.8 oz (64.3 kg)     GEN:  Well nourished, well developed in no  acute distress HEENT: Normal NECK: No JVD; No carotid bruits LYMPHATICS: No lymphadenopathy CARDIAC: RRR, no murmurs, no rubs, no gallops RESPIRATORY:  Clear to auscultation without rales, wheezing or rhonchi  ABDOMEN: Soft, non-tender, non-distended MUSCULOSKELETAL:  No edema; No deformity  SKIN: Warm and dry LOWER EXTREMITIES: no swelling NEUROLOGIC:  Alert and oriented x 3 PSYCHIATRIC:  Normal affect   ASSESSMENT:    1. Paroxysmal atrial fibrillation (HCC)   2. Benign hypertension with chronic kidney disease, stage III (HCC)   3. Pure hypercholesterolemia   4. Palpitations    PLAN:    In order of problems listed above:  Paroxysmal atrial fibrillation.  Anticoagulated denies having any recurrences continue present management. Essential hypertension blood pressure well-controlled continue present management. Dyslipidemia I did review blood test done by primary care physician her total cholesterol 215, LDL 127 HDL 67.  I calculated her 10 years predicted risk which is 20.6 which is high I recommended initiation of therapy for lowering cholesterol.  She had difficulty tolerating statin before and she does not want to use any medication she is very reluctant I told her what can help [place] to make that decision will be calcium score she agreed to have it done.  Will schedule her to have calcium score done and then decide about therapy if she required some therapy I think the first step will be to try Zetia   Medication Adjustments/Labs and Tests Ordered: Current medicines are reviewed at length with the patient today.  Concerns regarding medicines are outlined above.  No orders of the defined types were placed in this encounter.  Medication changes: No orders of the defined types were placed in this encounter.   Signed, Georgeanna Lea, MD, [facility] 05/27/2023 9:23 AM    [facility]

## 2023-05-27 NOTE — Addendum Note (Signed)
Addended by: Baldo Ash D on: 05/27/2023 09:31 AM   Modules accepted: Orders

## 2023-05-27 NOTE — Patient Instructions (Addendum)
Medication Instructions:  Your physician recommends that you continue on your current medications as directed. Please refer to the Current Medication list given to you today.  *If you need a refill on your cardiac medications before your next appointment, please call your pharmacy*   Lab Work: None Ordered If you have labs (blood work) drawn today and your tests are completely normal, you will receive your results only by: [place] (if you have MyChart) OR A paper copy in the mail If you have any lab test that is abnormal or we need to change your treatment, we will call you to review the results.   Testing/Procedures: We will order CT coronary calcium score. It will cost $99.00 and is not covered by insurance.  Please call to schedule.    [facility] [address] [place], [place] [address] [phone]     Follow-Up: At [facility], you and your health needs are our priority.  As part of our continuing mission to provide you with exceptional heart care, we have created designated Provider Care Teams.  These Care Teams include your primary Cardiologist (physician) and Advanced Practice Providers (APPs -  Physician Assistants and Nurse Practitioners) who all work together to provide you with the care you need, when you need it.  We recommend signing up for the patient portal called "MyChart".  Sign up information is provided on this After Visit Summary.  MyChart is used to connect with patients for Virtual Visits (Telemedicine).  Patients are able to view lab/test results, encounter notes, upcoming appointments, etc.  Non-urgent messages can be sent to your provider as well.   To learn more about what you can do with MyChart, go to [email or web address].    Your next appointment:   6 month(s)  The format for your next appointment:   In Person  Provider:   Jenne Campus, MD    Other Instructions NA

## 2023-06-16 ENCOUNTER — Ambulatory Visit (HOSPITAL_BASED_OUTPATIENT_CLINIC_OR_DEPARTMENT_OTHER)
Admission: RE | Admit: 2023-06-16 | Discharge: 2023-06-16 | Disposition: A | Discharge status: Home / Self Care | Payer: Medicare PPO | Source: Ambulatory Visit | Attending: Cardiology | Admitting: Cardiology

## 2023-06-16 DIAGNOSIS — Z8249 Family history of ischemic heart disease and other diseases of the circulatory system: Secondary | ICD-10-CM | POA: Insufficient documentation

## 2023-06-30 DIAGNOSIS — R5383 Other fatigue: Secondary | ICD-10-CM | POA: Insufficient documentation

## 2023-07-06 ENCOUNTER — Telehealth: Payer: Self-pay

## 2023-07-06 DIAGNOSIS — Z8249 Family history of ischemic heart disease and other diseases of the circulatory system: Secondary | ICD-10-CM

## 2023-07-06 DIAGNOSIS — R911 Solitary pulmonary nodule: Secondary | ICD-10-CM

## 2023-07-06 NOTE — Telephone Encounter (Signed)
Patient aware of results and recommendations and agreed with plan. CT order on file. Patent advised if she does not here from our office within 3-6 months, to please contact office.

## 2023-07-06 NOTE — Telephone Encounter (Signed)
-----   Message from Gypsy Balsam sent at 06/25/2023 12:30 PM EDT ----- Calcium score 0.  This is very good number, low risk for coronary artery disease, pulmonary nodule noted CT need to be repeated in 3 to 6 months

## 2023-07-07 ENCOUNTER — Ambulatory Visit
Admission: RE | Admit: 2023-07-07 | Discharge: 2023-07-07 | Disposition: A | Discharge status: Home / Self Care | Payer: Medicare PPO | Source: Ambulatory Visit | Attending: Internal Medicine | Admitting: Internal Medicine

## 2023-07-07 DIAGNOSIS — Z1231 Encounter for screening mammogram for malignant neoplasm of breast: Secondary | ICD-10-CM

## 2023-08-23 ENCOUNTER — Telehealth: Payer: Self-pay | Admitting: Cardiology

## 2023-08-23 ENCOUNTER — Other Ambulatory Visit: Payer: Self-pay

## 2023-08-23 MED ORDER — METOPROLOL SUCCINATE ER 50 MG PO TB24: 50.0000 mg | ORAL_TABLET | Freq: Every day | ORAL | 3 refills | Status: DC

## 2023-08-23 NOTE — Telephone Encounter (Signed)
*  STAT* If patient is at the pharmacy, call can be transferred to refill team.   1. Which medications need to be refilled? (please list name of each medication and dose if known)   metoprolol succinate (TOPROL-XL) 50 MG 24 hr tablet     4. Which pharmacy/location (including street and city if local pharmacy) is medication to be sent to?[id number] - [place], [place] - [address]    5. Do they need a 30 day or 90 day supply? 90

## 2023-08-23 NOTE — Telephone Encounter (Signed)
Metoprolol XL 50mg  #90 ref x 3 sent to [facility]

## 2023-10-06 ENCOUNTER — Other Ambulatory Visit (HOSPITAL_BASED_OUTPATIENT_CLINIC_OR_DEPARTMENT_OTHER): Payer: Medicare PPO

## 2023-10-07 ENCOUNTER — Encounter (HOSPITAL_BASED_OUTPATIENT_CLINIC_OR_DEPARTMENT_OTHER): Payer: Self-pay

## 2023-10-07 ENCOUNTER — Ambulatory Visit (HOSPITAL_BASED_OUTPATIENT_CLINIC_OR_DEPARTMENT_OTHER)
Admission: RE | Admit: 2023-10-07 | Discharge: 2023-10-07 | Disposition: A | Discharge status: Home / Self Care | Payer: Self-pay | Source: Ambulatory Visit | Attending: Cardiology | Admitting: Cardiology

## 2023-10-07 DIAGNOSIS — R911 Solitary pulmonary nodule: Secondary | ICD-10-CM

## 2023-10-07 DIAGNOSIS — Z8249 Family history of ischemic heart disease and other diseases of the circulatory system: Secondary | ICD-10-CM

## 2023-11-15 ENCOUNTER — Other Ambulatory Visit: Payer: Self-pay

## 2023-11-15 ENCOUNTER — Encounter (HOSPITAL_BASED_OUTPATIENT_CLINIC_OR_DEPARTMENT_OTHER): Payer: Self-pay | Admitting: Emergency Medicine

## 2023-11-15 ENCOUNTER — Ambulatory Visit (HOSPITAL_BASED_OUTPATIENT_CLINIC_OR_DEPARTMENT_OTHER)
Admission: EM | Admit: 2023-11-15 | Discharge: 2023-11-15 | Disposition: A | Discharge status: Home / Self Care | Payer: Medicare PPO | Attending: Emergency Medicine | Admitting: Emergency Medicine

## 2023-11-15 DIAGNOSIS — J101 Influenza due to other identified influenza virus with other respiratory manifestations: Secondary | ICD-10-CM | POA: Diagnosis not present

## 2023-11-15 LAB — POC COVID19/FLU A&B COMBO
Covid Antigen, POC: NEGATIVE
Influenza A Antigen, POC: POSITIVE — AB
Influenza B Antigen, POC: NEGATIVE

## 2023-11-15 NOTE — ED Triage Notes (Signed)
Patient states that she has had body aches, runny nose, sinus pressure and a fever since yesterday. Fever up to 99.7.

## 2023-11-15 NOTE — Discharge Instructions (Signed)
You have influenza A.  He take 500 mg of Tylenol every 8 hours to help with fever, body aches and chills.  Ensure you are staying well rested and drink at least 64 ounces of fluids daily.  1200 mg of Mucinex can help loosen up secretions as well as sleeping with a humidifier.  Symptoms should improve over the next 7 days.    Return to clinic for any new or urgent symptoms.

## 2023-11-15 NOTE — ED Provider Notes (Signed)
[facility]    CSN: [id number] Arrival date & time: 11/15/23  0930      History   Chief Complaint Chief Complaint  Patient presents with   Cough    HPI Carla Ross is a 78 y.o. female.   Patient presents to clinic for complaints of bodyaches, nasal congestion, rhinorrhea and low-grade temperature that started after church on Sunday, yesterday.  This morning she woke up and her temp was 99.7.  Yesterday she did take some Tylenol.  Denies any recent known sick contacts.  Has not had any wheezing or shortness of breath.  No GI symptoms, no nausea, vomiting or diarrhea.  The history is provided by the patient and medical records.  Cough   Past Medical History:  Diagnosis Date   A-fib [facility])    Adhesive capsulitis of right shoulder 06/14/2018   Benign hypertension with chronic kidney disease, stage III (HCC) 10/08/2020   Cancer (HCC)    Palpitations 06/12/2019   Paroxysmal atrial fibrillation (HCC) 04/10/2015   Overview:  Chads score 0, on aspirin alone.  Formatting of this note might be different from the original. Formatting of this note might be different from the original. Overview:  Chads score 0, on aspirin alone. Formatting of this note might be different from the original. Chads score 0, on aspirin alone.   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      Home Medications    Prior to Admission medications   Medication Sig Start Date End Date Taking? Authorizing Provider  apixaban (ELIQUIS) 5 MG TABS tablet Take 1 tablet (5 mg total) by mouth 2 (two) times daily. 12/14/22   Georgeanna Lea, MD  Calcium Carbonate (CALTRATE 600 PO) Take 1 tablet by mouth daily.    [provider]  EPINEPHrine 0.3 mg/0.3 mL IJ SOAJ injection Inject 0.3 mg into the muscle as needed for anaphylaxis. 03/22/20   [provider]  metoprolol succinate (TOPROL-XL) 50 MG 24 hr tablet Take 1 tablet (50 mg total) by mouth daily. 08/23/23   Georgeanna Lea, MD  Multiple Vitamin (MULTIVITAMIN) capsule Take 1 capsule by mouth daily. Unknown strength    [provider]    Family History Family History  Problem Relation Age of Onset   Ovarian cancer Mother    Liver cancer Father    Breast cancer Neg Hx     Social History Social History   Tobacco Use   Smoking status: Never   Smokeless tobacco: Never  Vaping Use   Vaping status: Never Used  Substance Use Topics   Alcohol use: Yes   Drug use: No     Allergies   Other and Statins   Review of Systems  Review of Systems  Per HPI   Physical Exam Triage Vital Signs ED Triage Vitals  Encounter Vitals Group     BP 11/15/23 1045 121/64     Systolic BP Percentile --      Diastolic BP Percentile --      Pulse Rate 11/15/23 1045 74     Resp 11/15/23 1045 14     Temp 11/15/23 1045 98.9 F (37.2 C)     Temp Source 11/15/23 1045 Oral     SpO2 11/15/23 1045 97 %     Weight --      Height --      Head Circumference --      Peak Flow --      Pain Score 11/15/23 1044 0     Pain Loc --      Pain Education --      Exclude from Growth Chart --    No data found.  Updated Vital Signs BP 121/64 (BP Location: Right Arm)   Pulse 74   Temp 98.9 F (37.2 C)  (Oral)   Resp 14   SpO2 97%   Visual Acuity Right Eye Distance:   Left Eye Distance:   Bilateral Distance:    Right Eye Near:   Left Eye Near:    Bilateral Near:     Physical Exam Vitals and nursing note reviewed.  Constitutional:      Appearance: Normal appearance.  HENT:     Head: Normocephalic and atraumatic.     Right Ear: External ear normal.     Left Ear: External ear normal.     Nose: Congestion and rhinorrhea present.     Mouth/Throat:     Mouth: Mucous membranes are moist.  Eyes:     Conjunctiva/sclera: Conjunctivae normal.  Cardiovascular:     Rate and Rhythm: Normal rate and regular rhythm.     Heart sounds: Normal heart sounds. No murmur heard. Pulmonary:     Effort: Pulmonary effort is normal. No respiratory distress.     Breath sounds: Normal breath sounds.  Musculoskeletal:        General: Normal range of motion.  Skin:    General: Skin is warm.  Neurological:     General: No focal deficit present.     Mental Status: She is alert.  Psychiatric:        Mood and Affect: Mood normal.      UC Treatments / Results  Labs (all labs ordered are listed, but only abnormal results are displayed) Labs Reviewed  POC COVID19/FLU A&B COMBO    EKG   Radiology No results found.  Procedures Procedures (including critical care time)  Medications Ordered in UC Medications - No data to display  Initial Impression / Assessment and Plan / UC Course  I have reviewed the triage vital signs and the nursing notes.  Pertinent labs & imaging results that were available during my care of the patient were reviewed by me and considered in my medical decision making (see chart for details).  Vitals and triage reviewed, patient is hemodynamically stable.  Congestion and rhinorrhea present on physical exam.  Lungs are vesicular, heart with regular rate and rhythm.  POC testing positive for influenza A.  Patient is vaccinated.  RBA of Tamiflu and potential benefits  discussed, patient has not been on medications and would like to withhold Tamiflu.  Symptomatic management encouraged.  Plan of care, follow-up care return precautions given, no questions at this time.  Final Clinical Impressions(s) / UC Diagnoses   Final diagnoses:  Influenza A     Discharge Instructions      You have influenza A.  He take 500 mg of Tylenol every 8 hours to help with fever, body aches and chills.  Ensure you are staying well rested and drink at least 64 ounces of fluids daily.  1200 mg of Mucinex can help loosen up secretions as well as sleeping with a humidifier.  Symptoms should improve over the next 7 days.    Return to clinic for any new or urgent symptoms.     ED Prescriptions   None    PDMP not reviewed this encounter.   Christne Platts, [place], [place] 11/15/23 1113

## 2023-12-17 ENCOUNTER — Other Ambulatory Visit: Payer: Self-pay | Admitting: Cardiology

## 2023-12-17 DIAGNOSIS — I480 Paroxysmal atrial fibrillation: Secondary | ICD-10-CM

## 2023-12-17 NOTE — Telephone Encounter (Signed)
 Eliquis 5mg  refill request received. Patient is 78 years old, weight-64kg, Crea-0.95 on 06/30/23 via [facility] Everywhere from [facility], Diagnosis-Afib, and last seen by Dr Bing Matter on 05/27/23. Dose is appropriate based on dosing criteria. Will send in refill to requested pharmacy.

## 2024-01-06 ENCOUNTER — Ambulatory Visit: Payer: Medicare PPO | Attending: Cardiology | Admitting: Cardiology

## 2024-01-06 ENCOUNTER — Encounter: Payer: Self-pay | Admitting: Cardiology

## 2024-01-06 VITALS — BP 122/60 | HR 68 | Ht 67.0 in | Wt 144.4 lb

## 2024-01-06 DIAGNOSIS — I480 Paroxysmal atrial fibrillation: Secondary | ICD-10-CM | POA: Diagnosis not present

## 2024-01-06 DIAGNOSIS — E7800 Pure hypercholesterolemia, unspecified: Secondary | ICD-10-CM | POA: Diagnosis not present

## 2024-01-06 DIAGNOSIS — I129 Hypertensive chronic kidney disease with stage 1 through stage 4 chronic kidney disease, or unspecified chronic kidney disease: Secondary | ICD-10-CM

## 2024-01-06 DIAGNOSIS — N183 Chronic kidney disease, stage 3 unspecified: Secondary | ICD-10-CM | POA: Diagnosis not present

## 2024-01-06 NOTE — Patient Instructions (Signed)

## 2024-01-06 NOTE — Progress Notes (Signed)
 Cardiology Office Note:    Date:  01/06/2024   ID:  Carla Ross, DOB [birth year], MRN [id number]  PCP:  Audie Pinto, FNP  Cardiologist:  Gypsy Balsam, MD    Referring MD: Audie Pinto, FNP   Chief Complaint  Patient presents with   Results    History of Present Illness:    Carla Ross is a 78 y.o. female past medical history significant for paroxysmal atrial fibrillation, CHADS2 Vascor equals 3, she is anticoagulated with Eliquis, dyslipidemia, palpitations.  Comes today to months for follow-up overall doing great.  Asymptomatic she did have flu a few months ago but recovered completely.  Goes to [facility] on the regular basis exercising.  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  Formatting of this note might be different from the original. Formatting of this note might be different from the original. Overview:  Chads score 0, on aspirin alone. Formatting of this note might be different from the original. Chads score 0, on aspirin alone.   Pure hypercholesterolemia 04/10/2015    Past Surgical History:  Procedure Laterality Date   ABDOMINAL HYSTERECTOMY     BREAST EXCISIONAL BIOPSY Right    benign   PILENDAL CYST SURGERY      Current Medications: Current Meds  Medication Sig   apixaban (ELIQUIS) 5 MG TABS tablet TAKE 1 TABLET(5 MG) BY MOUTH TWICE DAILY (Patient taking differently: Take 5 mg by mouth 2 (two) times daily.)   Calcium Carbonate (CALTRATE 600 PO) Take 1 tablet by mouth daily.   EPINEPHrine 0.3 mg/0.3 mL IJ SOAJ injection Inject 0.3 mg into the muscle as needed for anaphylaxis.   metoprolol succinate (TOPROL-XL) 50 MG 24  hr tablet Take 1 tablet (50 mg total) by mouth daily.   Multiple Vitamin (MULTIVITAMIN) capsule Take 1 capsule by mouth daily. Unknown strength     Allergies:   Other and Statins   Social History   Socioeconomic History   Marital status: Married    Spouse name: Not on file   Number of children: Not on file   Years of education: Not on file   Highest education level: Not on file  Occupational History   Not on file  Tobacco Use   Smoking status: Never   Smokeless tobacco: Never  Vaping Use   Vaping status: Never Used  Substance and Sexual Activity   Alcohol use: Yes   Drug use: No   Sexual activity: Not on file  Other Topics Concern   Not on file  Social History Narrative   Not on file   Social Drivers of Health   Financial Resource Strain: Not on file  Food Insecurity: Not on file  Transportation Needs: Not on file  Physical Activity: Not on file  Stress: Not on file  Social Connections: Not on file     Family History: The patient's family history includes Liver cancer in her father; Ovarian cancer in her mother. There is no history of Breast cancer. ROS:   Please see the history of present illness.    All 14 point review of systems negative except as described per history of present illness  EKGs/Labs/Other Studies Reviewed:    EKG Interpretation Date/Time:  Thursday January 06 2024 09:02:05 EDT Ventricular Rate:  68 PR Interval:  138 QRS Duration:  78 QT Interval:  394 QTC Calculation: 418 R Axis:   78  Text Interpretation: Normal sinus rhythm Normal ECG No previous ECGs available Confirmed by Gypsy Balsam [id number]) on 01/06/2024 9:06:32 AM    Recent Labs: No results found for requested labs within last 365 days.  Recent Lipid Panel    Component Value Date/Time   CHOL 222 (H) 08/21/2020 0821   TRIG 124 08/21/2020 0821   HDL 68 08/21/2020 0821   CHOLHDL 3.3 08/21/2020 0821   LDLCALC 132 (H) 08/21/2020 0821    Physical Exam:    VS:  BP 122/60  (BP Location: Right Arm, Patient Position: Sitting)   Pulse 68   Ht 5\' 7"  (1.702 m)   Wt 144 lb 6.4 oz (65.5 kg)   SpO2 99%   BMI 22.62 kg/m     Wt Readings from Last 3 Encounters:  01/06/24 144 lb 6.4 oz (65.5 kg)  05/27/23 141 lb 3.2 oz (64 kg)  08/06/22 142 lb 12.8 oz (64.8 kg)     GEN:  Well nourished, well developed in no acute distress HEENT: Normal NECK: No JVD; No carotid bruits LYMPHATICS: No lymphadenopathy CARDIAC: RRR, no murmurs, no rubs, no gallops RESPIRATORY:  Clear to auscultation without rales, wheezing or rhonchi  ABDOMEN: Soft, non-tender, non-distended MUSCULOSKELETAL:  No edema; No deformity  SKIN: Warm and dry LOWER EXTREMITIES: no swelling NEUROLOGIC:  Alert and oriented x 3 PSYCHIATRIC:  Normal affect   ASSESSMENT:    1. Paroxysmal atrial fibrillation (HCC)   2. Benign hypertension with chronic kidney disease, stage III (HCC)   3. Pure hypercholesterolemia    PLAN:    In order of problems listed above:  Paroxysmal atrial fibrillation denies have any palpitation continue present management which include anticoagulation. Benign essential hypertension: Blood pressure well-controlled. Dyslipidemia she does have elevated cholesterol however she did have calcium score done is 0.  I did calculated her 10 years predicted risk which is only 2.1 which is very low no need to treat with medications.  HDL 67 LDL 136. We did talk about healthy lifestyle need to exercise on the regular basis which she does already. Pulmonary nodules noted on the CT.  She is at low risk and no smoking no family history of lung cancer.  Will repeat CT as per her wishes within 18 to 24 months   Medication Adjustments/Labs and Tests Ordered: Current medicines are reviewed at length with the patient today.  Concerns regarding medicines are outlined above.  Orders Placed This Encounter  Procedures   EKG 12-Lead   Medication changes: No orders of the defined types were placed in  this encounter.   Signed, Georgeanna Lea, MD, [facility] 01/06/2024 9:20 AM    [facility]

## 2024-05-30 ENCOUNTER — Other Ambulatory Visit: Payer: Self-pay | Admitting: Family Medicine

## 2024-05-30 DIAGNOSIS — Z1231 Encounter for screening mammogram for malignant neoplasm of breast: Secondary | ICD-10-CM

## 2024-06-13 ENCOUNTER — Other Ambulatory Visit: Payer: Self-pay | Admitting: Cardiology

## 2024-06-13 DIAGNOSIS — I480 Paroxysmal atrial fibrillation: Secondary | ICD-10-CM

## 2024-06-13 NOTE — Telephone Encounter (Signed)
 Eliquis  5mg  refill request received. Patient is 78 years old, weight-65.5kg, Crea-0.95 on 06/30/23, Diagnosis-Afib, and last seen by Dr. Bernie on 01/06/24. Dose is appropriate based on dosing criteria. Will send in refill to requested pharmacy.

## 2024-07-10 ENCOUNTER — Ambulatory Visit

## 2024-07-11 ENCOUNTER — Ambulatory Visit

## 2024-07-26 ENCOUNTER — Ambulatory Visit
Admission: RE | Admit: 2024-07-26 | Discharge: 2024-07-26 | Disposition: A | Discharge status: Home / Self Care | Payer: Medicare PPO | Source: Ambulatory Visit | Attending: Family Medicine | Admitting: Family Medicine

## 2024-07-26 DIAGNOSIS — Z1231 Encounter for screening mammogram for malignant neoplasm of breast: Secondary | ICD-10-CM

## 2024-08-14 ENCOUNTER — Other Ambulatory Visit: Payer: Self-pay | Admitting: Cardiology

## 2024-08-15 ENCOUNTER — Other Ambulatory Visit: Payer: Self-pay | Admitting: Cardiology

## 2024-11-11 ENCOUNTER — Other Ambulatory Visit: Payer: Self-pay | Admitting: Cardiology
